# Patient Record
Sex: Male | Born: 1985 | Race: White | Hispanic: No | Marital: Single | Smoking: Former smoker
Health system: Southern US, Community
[De-identification: ages and names within clinical notes are randomized; demographics above are authoritative.]

## PROBLEM LIST (undated history)

## (undated) ENCOUNTER — Ambulatory Visit (HOSPITAL_COMMUNITY): Payer: MEDICAID

## (undated) DIAGNOSIS — T148XXA Other injury of unspecified body region, initial encounter: Secondary | ICD-10-CM

## (undated) DIAGNOSIS — J189 Pneumonia, unspecified organism: Secondary | ICD-10-CM

## (undated) HISTORY — PX: WISDOM TOOTH EXTRACTION: SHX21

## (undated) HISTORY — PX: TONSILLECTOMY: SUR1361

---
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
## 2006-05-17 ENCOUNTER — Encounter: Admission: RE | Admit: 2006-05-17 | Discharge: 2006-05-17 | Payer: Self-pay | Admitting: Family Medicine

## 2008-06-24 ENCOUNTER — Encounter: Admission: RE | Admit: 2008-06-24 | Discharge: 2008-06-24 | Payer: Self-pay | Admitting: Family Medicine

## 2009-08-11 ENCOUNTER — Encounter: Admission: RE | Admit: 2009-08-11 | Discharge: 2009-08-11 | Payer: Self-pay | Admitting: Family Medicine

## 2010-11-29 ENCOUNTER — Emergency Department (HOSPITAL_BASED_OUTPATIENT_CLINIC_OR_DEPARTMENT_OTHER)
Admission: EM | Admit: 2010-11-29 | Discharge: 2010-11-29 | Discharge status: Against Medical Advice | Payer: Self-pay | Attending: Emergency Medicine | Admitting: Emergency Medicine

## 2010-11-29 DIAGNOSIS — Z0389 Encounter for observation for other suspected diseases and conditions ruled out: Secondary | ICD-10-CM | POA: Insufficient documentation

## 2010-11-30 ENCOUNTER — Emergency Department (HOSPITAL_COMMUNITY)
Admission: EM | Admit: 2010-11-30 | Discharge: 2010-11-30 | Disposition: A | Discharge status: Home / Self Care | Payer: Self-pay | Attending: Emergency Medicine | Admitting: Emergency Medicine

## 2010-11-30 DIAGNOSIS — F411 Generalized anxiety disorder: Secondary | ICD-10-CM | POA: Insufficient documentation

## 2010-11-30 DIAGNOSIS — F1010 Alcohol abuse, uncomplicated: Secondary | ICD-10-CM | POA: Insufficient documentation

## 2010-11-30 DIAGNOSIS — S01501A Unspecified open wound of lip, initial encounter: Secondary | ICD-10-CM | POA: Insufficient documentation

## 2010-11-30 DIAGNOSIS — IMO0002 Reserved for concepts with insufficient information to code with codable children: Secondary | ICD-10-CM | POA: Insufficient documentation

## 2010-11-30 DIAGNOSIS — K1370 Unspecified lesions of oral mucosa: Secondary | ICD-10-CM | POA: Insufficient documentation

## 2010-11-30 DIAGNOSIS — R000 Tachycardia, unspecified: Secondary | ICD-10-CM | POA: Insufficient documentation

## 2012-03-24 ENCOUNTER — Ambulatory Visit
Admission: RE | Admit: 2012-03-24 | Discharge: 2012-03-24 | Disposition: A | Discharge status: Home / Self Care | Payer: BC Managed Care – PPO | Source: Ambulatory Visit | Attending: Family Medicine | Admitting: Family Medicine

## 2012-03-24 ENCOUNTER — Other Ambulatory Visit: Payer: Self-pay | Admitting: Family Medicine

## 2012-03-24 DIAGNOSIS — R071 Chest pain on breathing: Secondary | ICD-10-CM

## 2012-03-24 DIAGNOSIS — R0789 Other chest pain: Secondary | ICD-10-CM

## 2015-08-04 ENCOUNTER — Emergency Department (HOSPITAL_COMMUNITY)
Admission: EM | Admit: 2015-08-04 | Discharge: 2015-08-05 | Disposition: A | Discharge status: Home / Self Care | Payer: No Typology Code available for payment source | Attending: Emergency Medicine | Admitting: Emergency Medicine

## 2015-08-04 ENCOUNTER — Encounter (HOSPITAL_COMMUNITY): Payer: Self-pay | Admitting: *Deleted

## 2015-08-04 ENCOUNTER — Emergency Department (HOSPITAL_COMMUNITY): Payer: No Typology Code available for payment source

## 2015-08-04 DIAGNOSIS — S161XXA Strain of muscle, fascia and tendon at neck level, initial encounter: Secondary | ICD-10-CM | POA: Diagnosis not present

## 2015-08-04 DIAGNOSIS — S8002XA Contusion of left knee, initial encounter: Secondary | ICD-10-CM | POA: Diagnosis not present

## 2015-08-04 DIAGNOSIS — Y92410 Unspecified street and highway as the place of occurrence of the external cause: Secondary | ICD-10-CM | POA: Insufficient documentation

## 2015-08-04 DIAGNOSIS — Y998 Other external cause status: Secondary | ICD-10-CM | POA: Diagnosis not present

## 2015-08-04 DIAGNOSIS — S199XXA Unspecified injury of neck, initial encounter: Secondary | ICD-10-CM | POA: Diagnosis present

## 2015-08-04 DIAGNOSIS — S8991XA Unspecified injury of right lower leg, initial encounter: Secondary | ICD-10-CM | POA: Insufficient documentation

## 2015-08-04 DIAGNOSIS — Y9355 Activity, bike riding: Secondary | ICD-10-CM | POA: Diagnosis not present

## 2015-08-04 DIAGNOSIS — S39012A Strain of muscle, fascia and tendon of lower back, initial encounter: Secondary | ICD-10-CM | POA: Insufficient documentation

## 2015-08-04 MED ORDER — CYCLOBENZAPRINE HCL 10 MG PO TABS: 10.0000 mg | ORAL_TABLET | Freq: Two times a day (BID) | ORAL | Status: DC | PRN

## 2015-08-04 MED ORDER — CYCLOBENZAPRINE HCL 10 MG PO TABS
10.0000 mg | ORAL_TABLET | Freq: Once | ORAL | Status: AC
  Administered 2015-08-04: 10 mg via ORAL
  Filled 2015-08-04: qty 1

## 2015-08-04 MED ORDER — HYDROCODONE-ACETAMINOPHEN 5-325 MG PO TABS: 1.0000 | ORAL_TABLET | Freq: Four times a day (QID) | ORAL | Status: DC | PRN

## 2015-08-04 MED ORDER — HYDROCODONE-ACETAMINOPHEN 5-325 MG PO TABS
1.0000 | ORAL_TABLET | Freq: Once | ORAL | Status: AC
  Administered 2015-08-04: 1 via ORAL
  Filled 2015-08-04: qty 1
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
## 2015-08-04 NOTE — Discharge Instructions (Signed)
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
## 2015-08-06 ENCOUNTER — Emergency Department (HOSPITAL_COMMUNITY)
Admission: EM | Admit: 2015-08-06 | Discharge: 2015-08-06 | Disposition: A | Discharge status: Home / Self Care | Payer: No Typology Code available for payment source | Attending: Emergency Medicine | Admitting: Emergency Medicine

## 2015-08-06 ENCOUNTER — Encounter (HOSPITAL_COMMUNITY): Payer: Self-pay | Admitting: Emergency Medicine

## 2015-08-06 DIAGNOSIS — Z09 Encounter for follow-up examination after completed treatment for conditions other than malignant neoplasm: Secondary | ICD-10-CM | POA: Diagnosis present

## 2015-08-06 DIAGNOSIS — S161XXD Strain of muscle, fascia and tendon at neck level, subsequent encounter: Secondary | ICD-10-CM | POA: Insufficient documentation

## 2015-08-06 DIAGNOSIS — S8002XD Contusion of left knee, subsequent encounter: Secondary | ICD-10-CM

## 2015-08-06 MED ORDER — NAPROXEN 500 MG PO TABS: 500.0000 mg | ORAL_TABLET | Freq: Two times a day (BID) | ORAL | Status: DC

## 2015-08-06 MED ORDER — OXYCODONE-ACETAMINOPHEN 5-325 MG PO TABS: 2.0000 | ORAL_TABLET | ORAL | Status: DC | PRN

## 2015-08-06 NOTE — ED Notes (Signed)
Patient states was given norco and flexeril and "it doesn't touch the pain".   Patient requesting additional pain medicine.
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
## 2015-08-06 NOTE — Discharge Instructions (Signed)
Elastic Bandage and RICE °WHAT DOES AN ELASTIC BANDAGE DO? °Elastic bandages come in different shapes and sizes. They generally provide support to your injury and reduce swelling while you are healing, but they can perform different functions. Your health care provider will help you to decide what is best for your protection, recovery, or rehabilitation following an injury. °WHAT ARE SOME GENERAL TIPS FOR USING AN ELASTIC BANDAGE? °· Use the bandage as directed by the maker of the bandage that you are using. °· Do not wrap the bandage too tightly. This may cut off the circulation in the arm or leg in the area below the bandage. °¨ If part of your body beyond the bandage becomes blue, numb, cold, swollen, or is more painful, your bandage is most likely too tight. If this occurs, remove your bandage and reapply it more loosely. °· See your health care provider if the bandage seems to be making your problems worse rather than better. °· An elastic bandage should be removed and reapplied every 3-4 hours or as directed by your health care provider. °WHAT IS RICE? °The routine care of many injuries includes rest, ice, compression, and elevation (RICE therapy).  °Rest °Rest is required to allow your body to heal. Generally, you can resume your routine activities when you are comfortable and have been given permission by your health care provider. °Ice °Icing your injury helps to keep the swelling down and it reduces pain. Do not apply ice directly to your skin. °· Put ice in a plastic bag. °· Place a towel between your skin and the bag. °· Leave the ice on for 20 minutes, 2-3 times per day. °Do this for as long as you are directed by your health care provider. °Compression °Compression helps to keep swelling down, gives support, and helps with discomfort. Compression may be done with an elastic bandage. °Elevation °Elevation helps to reduce swelling and it decreases pain. If possible, your injured area should be placed at  or above the level of your heart or the center of your chest. °WHEN SHOULD I SEEK MEDICAL CARE? °You should seek medical care if: °· You have persistent pain and swelling. °· Your symptoms are getting worse rather than improving. °These symptoms may indicate that further evaluation or further X-rays are needed. Sometimes, X-rays may not show a small broken bone (fracture) until a number of days later. Make a follow-up appointment with your health care provider. Ask when your X-ray results will be ready. Make sure that you get your X-ray results. °WHEN SHOULD I SEEK IMMEDIATE MEDICAL CARE? °You should seek immediate medical care if: °· You have a sudden onset of severe pain at or below the area of your injury. °· You develop redness or increased swelling around your injury. °· You have tingling or numbness at or below the area of your injury that does not improve after you remove the elastic bandage. °  °This information is not intended to replace advice given to you by your health care provider. Make sure you discuss any questions you have with your health care provider. °  °Document Released: 08/28/2001 Document Revised: 11/27/2014 Document Reviewed: 10/22/2013 °[employer] Interactive Patient Education ©2016 [employer] ° °
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
## 2015-08-06 NOTE — ED Notes (Signed)
Pt stable, ambulatory, states understanding of discharge instructions 

## 2016-01-12 ENCOUNTER — Emergency Department (HOSPITAL_COMMUNITY): Payer: Self-pay

## 2016-01-12 ENCOUNTER — Emergency Department (HOSPITAL_COMMUNITY)
Admission: EM | Admit: 2016-01-12 | Discharge: 2016-01-12 | Disposition: A | Discharge status: Home / Self Care | Payer: Self-pay | Attending: Emergency Medicine | Admitting: Emergency Medicine

## 2016-01-12 ENCOUNTER — Encounter (HOSPITAL_COMMUNITY): Payer: Self-pay

## 2016-01-12 DIAGNOSIS — Y939 Activity, unspecified: Secondary | ICD-10-CM | POA: Insufficient documentation

## 2016-01-12 DIAGNOSIS — W11XXXA Fall on and from ladder, initial encounter: Secondary | ICD-10-CM | POA: Insufficient documentation

## 2016-01-12 DIAGNOSIS — M545 Low back pain: Secondary | ICD-10-CM | POA: Insufficient documentation

## 2016-01-12 DIAGNOSIS — Y929 Unspecified place or not applicable: Secondary | ICD-10-CM | POA: Insufficient documentation

## 2016-01-12 DIAGNOSIS — Y999 Unspecified external cause status: Secondary | ICD-10-CM | POA: Insufficient documentation

## 2016-01-12 MED ORDER — METHOCARBAMOL 500 MG PO TABS
1000.0000 mg | ORAL_TABLET | Freq: Once | ORAL | Status: AC
Start: 2016-01-12 — End: 2016-01-12
  Administered 2016-01-12: 1000 mg via ORAL
  Filled 2016-01-12: qty 2

## 2016-01-12 MED ORDER — NAPROXEN 500 MG PO TABS: 500.0000 mg | ORAL_TABLET | Freq: Two times a day (BID) | ORAL | 0 refills | Status: DC

## 2016-01-12 MED ORDER — METHOCARBAMOL 500 MG PO TABS
500.0000 mg | ORAL_TABLET | Freq: Two times a day (BID) | ORAL | 0 refills | Status: DC | PRN
Start: 2016-01-12 — End: 2016-04-15

## 2016-01-12 MED ORDER — HYDROCODONE-ACETAMINOPHEN 5-325 MG PO TABS
1.0000 | ORAL_TABLET | Freq: Once | ORAL | Status: AC
  Administered 2016-01-12: 1 via ORAL
  Filled 2016-01-12: qty 1

## 2016-01-12 NOTE — ED Notes (Signed)
Pt returned from xray
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
## 2016-01-12 NOTE — ED Triage Notes (Signed)
Pt reports he fell off a ladder on Friday that was approximately 20 feet in height. Pt reports he has chronic back pain and has gotten more severe lately. He reports recent falls due to having uncontrolled pain. Pt denies any dysuria or loss of bowel control.

## 2016-04-15 ENCOUNTER — Ambulatory Visit (HOSPITAL_COMMUNITY)
Admission: EM | Admit: 2016-04-15 | Discharge: 2016-04-15 | Disposition: A | Discharge status: Home / Self Care | Payer: Self-pay | Attending: Family Medicine | Admitting: Family Medicine

## 2016-04-15 ENCOUNTER — Encounter (HOSPITAL_COMMUNITY): Payer: Self-pay | Admitting: Emergency Medicine

## 2016-04-15 DIAGNOSIS — R6889 Other general symptoms and signs: Secondary | ICD-10-CM

## 2016-04-15 MED ORDER — OSELTAMIVIR PHOSPHATE 75 MG PO CAPS: 75.0000 mg | ORAL_CAPSULE | Freq: Two times a day (BID) | ORAL | 0 refills | Status: DC

## 2016-04-15 MED ORDER — HYDROCODONE-HOMATROPINE 5-1.5 MG/5ML PO SYRP: 5.0000 mL | ORAL_SOLUTION | Freq: Four times a day (QID) | ORAL | 0 refills | Status: DC | PRN

## 2016-04-15 NOTE — ED Provider Notes (Signed)
[facility]    CSN: [id number] Arrival date & time: 04/15/16  1454     History   Chief Complaint Chief Complaint  Patient presents with  . URI    HPI Jerry Fischer is a 31 y.o. male.   This is 31 year old man who presents with 2 days of upper respiratory symptoms. The illness began late Tuesday night, 40 hours ago, with violent nausea and vomiting as well as diarrhea. The vomiting subsided but he didn't stop having diarrhea until he started Imodium. He's been taking Tylenol as well.  Yesterday developed myalgias continued to have fever. He feels weak now but he is keeping fluids down. Patient works for a [employer]      History reviewed. No pertinent past medical history.  There are no active problems to display for this patient.   History reviewed. No pertinent surgical history.     Home Medications    Prior to Admission medications   Medication Sig Start Date End Date Taking? Authorizing Provider  HYDROcodone-homatropine (HYDROMET) 5-1.5 MG/5ML syrup Take 5 mLs by mouth every 6 (six) hours as needed for cough. 04/15/16   Elvina SidleKurt Zeda Gangwer, MD  oseltamivir (TAMIFLU) 75 MG capsule Take 1 capsule (75 mg total) by mouth every 12 (twelve) hours. 04/15/16   Elvina SidleKurt Jalea Bronaugh, MD    Family History No family history on file.  Social History Social History  Substance Use Topics  . Smoking status: Never Smoker  . Smokeless tobacco: Never Used  . Alcohol use No     Allergies   Patient has no known allergies.   Review of Systems Review of Systems  Constitutional: Positive for fatigue and fever.  HENT: Positive for sore throat.   Eyes: Negative.   Respiratory: Positive for cough.   Gastrointestinal: Positive for diarrhea, nausea and vomiting.  Genitourinary: Negative.   Musculoskeletal: Positive for arthralgias and myalgias.  Neurological: Positive for headaches.  Psychiatric/Behavioral: Negative.      Physical Exam Triage Vital Signs ED  Triage Vitals  Enc Vitals Group     BP 04/15/16 1523 125/83     Pulse Rate 04/15/16 1523 90     Resp 04/15/16 1523 20     Temp 04/15/16 1523 99.8 F (37.7 C)     Temp Source 04/15/16 1523 Oral     SpO2 04/15/16 1523 96 %     Weight --      Height --      Head Circumference --      Peak Flow --      Pain Score 04/15/16 1522 8     Pain Loc --      Pain Edu? --      Excl. in GC? --    No data found.   Updated Vital Signs BP 125/83 (BP Location: Left Arm)   Pulse 90   Temp 99.8 F (37.7 C) (Oral)   Resp 20   SpO2 96%    Physical Exam  Constitutional: He is oriented to person, place, and time. He appears well-developed and well-nourished.  HENT:  Head: Normocephalic.  Right Ear: External ear normal.  Left Ear: External ear normal.  Mouth/Throat: Oropharynx is clear and moist.  Eyes: Conjunctivae and EOM are normal. Pupils are equal, round, and reactive to light.  Neck: Normal range of motion. Neck supple.  Cardiovascular: Normal rate, regular rhythm and normal heart sounds.   Pulmonary/Chest: Effort normal and breath sounds normal.  Musculoskeletal: Normal range of motion.  Neurological: He  is alert and oriented to person, place, and time.  Skin: Skin is warm and dry.  Nursing note and vitals reviewed.    UC Treatments / Results  Labs (all labs ordered are listed, but only abnormal results are displayed) Labs Reviewed - No data to display  EKG  EKG Interpretation None       Radiology No results found.  Procedures Procedures (including critical care time)  Medications Ordered in UC Medications - No data to display   Initial Impression / Assessment and Plan / UC Course  I have reviewed the triage vital signs and the nursing notes.  Pertinent labs & imaging results that were available during my care of the patient were reviewed by me and considered in my medical decision making (see chart for details).     Final Clinical Impressions(s) / UC  Diagnoses   Final diagnoses:  Flu-like symptoms    New Prescriptions New Prescriptions   HYDROCODONE-HOMATROPINE (HYDROMET) 5-1.5 MG/5ML SYRUP    Take 5 mLs by mouth every 6 (six) hours as needed for cough.   OSELTAMIVIR (TAMIFLU) 75 MG CAPSULE    Take 1 capsule (75 mg total) by mouth every 12 (twelve) hours.     Elvina Sidle, MD 04/15/16 [phone]

## 2016-04-15 NOTE — Discharge Instructions (Signed)
He had the symptoms of influenza. He just stay home and just stay on liquids and crackers until her stomach settles. Please return in 2 days if you're still having symptoms

## 2016-04-15 NOTE — ED Triage Notes (Signed)
Nasal congestion, cough, general aches, fever.  Onset Tuesday.  Had a time frame of vomiting and diarrhea. One early morning stool today, no vomiting today

## 2016-06-25 ENCOUNTER — Encounter (HOSPITAL_COMMUNITY): Payer: Self-pay | Admitting: Emergency Medicine

## 2016-06-25 ENCOUNTER — Ambulatory Visit (HOSPITAL_COMMUNITY)
Admission: EM | Admit: 2016-06-25 | Discharge: 2016-06-25 | Disposition: A | Discharge status: Home / Self Care | Payer: Self-pay | Attending: Internal Medicine | Admitting: Internal Medicine

## 2016-06-25 DIAGNOSIS — L237 Allergic contact dermatitis due to plants, except food: Secondary | ICD-10-CM

## 2016-06-25 DIAGNOSIS — J069 Acute upper respiratory infection, unspecified: Secondary | ICD-10-CM

## 2016-06-25 DIAGNOSIS — B9789 Other viral agents as the cause of diseases classified elsewhere: Secondary | ICD-10-CM

## 2016-06-25 MED ORDER — PROMETHAZINE-DM 6.25-15 MG/5ML PO SYRP: 5.0000 mL | ORAL_SOLUTION | Freq: Four times a day (QID) | ORAL | 0 refills | Status: DC | PRN

## 2016-06-25 MED ORDER — FEXOFENADINE-PSEUDOEPHED ER 60-120 MG PO TB12: 1.0000 | ORAL_TABLET | Freq: Two times a day (BID) | ORAL | 0 refills | Status: DC

## 2016-06-25 MED ORDER — METHYLPREDNISOLONE SODIUM SUCC 125 MG IJ SOLR
INTRAMUSCULAR | Status: AC
  Filled 2016-06-25: qty 2

## 2016-06-25 MED ORDER — METHYLPREDNISOLONE SODIUM SUCC 125 MG IJ SOLR
80.0000 mg | Freq: Once | INTRAMUSCULAR | Status: AC
  Administered 2016-06-25: 80 mg via INTRAMUSCULAR

## 2016-06-25 NOTE — ED Provider Notes (Signed)
CSN: [id number]     Arrival date & time 06/25/16  1607 History   First MD Initiated Contact with Patient 06/25/16 1712     Chief Complaint  Patient presents with  . Poison Ivy  . URI   (Consider location/radiation/quality/duration/timing/severity/associated sxs/prior Treatment) Subjective:   Jerry Fischer is a 31 y.o. male here for evaluation of a cough.  The cough is productive of green/yellow sputum. His chest hurts during coughing. The cough has been waxing and waning over time and is aggravated by nothing. Onset of symptoms was 4 days ago and has been unchanged since that time. He denies any wheezing, dyspnea or hemoptysis. Patient does not have a history of asthma. Patient has not had recent travel. Patient does not have a history of smoking.   The patient also complains of a rash. Rash started 4 days ago after he accidentally got into some poison ivy. Initial distribution: abdomen, bilateral arm and bilateral lower leg. Lesions are pink in color and are of raised texture. The rash is extensively pruritic but has not changed over time. Patient applied Clorox and calamine lotion to the rash without much relief.   The following portions of the patient's history were reviewed and updated as appropriate: allergies, current medications, past family history, past medical history, past social history, past surgical history and problem list.           History reviewed. No pertinent past medical history. History reviewed. No pertinent surgical history. History reviewed. No pertinent family history. Social History  Substance Use Topics  . Smoking status: Never Smoker  . Smokeless tobacco: Never Used  . Alcohol use No    Review of Systems  HENT: Positive for congestion and rhinorrhea. Negative for sore throat.   Respiratory: Positive for cough. Negative for chest tightness and shortness of breath.   Cardiovascular:       Pleuritic chest pain   Gastrointestinal: Negative for nausea  and vomiting.  Skin: Positive for rash.    Allergies  Patient has no known allergies.  Home Medications   Prior to Admission medications   Medication Sig Start Date End Date Taking? Authorizing Provider  HYDROcodone-homatropine (HYDROMET) 5-1.5 MG/5ML syrup Take 5 mLs by mouth every 6 (six) hours as needed for cough. 04/15/16   Elvina Sidle, MD  oseltamivir (TAMIFLU) 75 MG capsule Take 1 capsule (75 mg total) by mouth every 12 (twelve) hours. 04/15/16   Elvina Sidle, MD   Meds Ordered and Administered this Visit   Medications  methylPREDNISolone sodium succinate (SOLU-MEDROL) 125 mg/2 mL injection 80 mg (not administered)    BP 123/68 (BP Location: Right Arm)   Pulse 80   Temp 98.8 F (37.1 C) (Oral)   Resp 18   SpO2 96%  No data found.   Physical Exam  Constitutional: He is oriented to person, place, and time. He appears well-developed and well-nourished. No distress.  Eyes: Conjunctivae are normal. Pupils are equal, round, and reactive to light.  Cardiovascular: Normal rate, regular rhythm and normal heart sounds.   Pulmonary/Chest: Effort normal and breath sounds normal.  Neurological: He is alert and oriented to person, place, and time.  Skin: Skin is warm and dry. Rash noted.  Diffused rash to bilateral arms, abdomen and bilateral upper legs. Lesions are raised and red in color. No evidence of cellulitis. No drainage.    Psychiatric: He has a normal mood and affect.    Urgent Care Course     Procedures (including critical care time)  Labs Review Labs Reviewed - No data to display  Imaging Review No results found.   Visual Acuity Review  Right Eye Distance:   Left Eye Distance:   Bilateral Distance:    Right Eye Near:   Left Eye Near:    Bilateral Near:         MDM   1. Viral URI with cough   2. Poison ivy dermatitis    VIRAL URI w/ COUGH: Rx Allegra-D and promethazine-dextromethorphan PRN for cough.  Explained lack of efficacy of  antibiotics in viral disease. Avoid exposure to tobacco smoke and fumes. Call if shortness of breath worsens, blood in sputum, change in character of cough, development of fever or chills, inability to maintain nutrition and hydration. Avoid exposure to tobacco smoke and fumes.  POISON IVY: Solumedrol IM injection in clinic. Continue with topical therapies (calamine lotion, wet compresses). Avoid scratching.   Follow-up in 1 week, or sooner as needed.  Discussed diagnosis and treatment with patient. All questions have been answered and all concerns have been addressed. The patient verbalized understanding and had no further questions     Lurline Idol, FNP 06/25/16 1734

## 2016-06-25 NOTE — ED Triage Notes (Signed)
Here for poison ivy/oak onset x3 days on arms, abd   Also c/o cold sx onset 3 days associated w/cough  Denies fevers  A&O x4... NAD

## 2017-10-31 ENCOUNTER — Emergency Department (HOSPITAL_COMMUNITY): Payer: Self-pay

## 2017-10-31 ENCOUNTER — Emergency Department (HOSPITAL_COMMUNITY)
Admission: EM | Admit: 2017-10-31 | Discharge: 2017-10-31 | Disposition: A | Discharge status: Home / Self Care | Payer: Self-pay | Attending: Emergency Medicine | Admitting: Emergency Medicine

## 2017-10-31 ENCOUNTER — Encounter (HOSPITAL_COMMUNITY): Payer: Self-pay | Admitting: *Deleted

## 2017-10-31 DIAGNOSIS — W11XXXA Fall on and from ladder, initial encounter: Secondary | ICD-10-CM | POA: Insufficient documentation

## 2017-10-31 DIAGNOSIS — T148XXA Other injury of unspecified body region, initial encounter: Secondary | ICD-10-CM

## 2017-10-31 DIAGNOSIS — Y929 Unspecified place or not applicable: Secondary | ICD-10-CM | POA: Insufficient documentation

## 2017-10-31 DIAGNOSIS — Y9389 Activity, other specified: Secondary | ICD-10-CM | POA: Insufficient documentation

## 2017-10-31 DIAGNOSIS — S199XXA Unspecified injury of neck, initial encounter: Secondary | ICD-10-CM | POA: Insufficient documentation

## 2017-10-31 DIAGNOSIS — M7918 Myalgia, other site: Secondary | ICD-10-CM

## 2017-10-31 DIAGNOSIS — Y999 Unspecified external cause status: Secondary | ICD-10-CM | POA: Insufficient documentation

## 2017-10-31 DIAGNOSIS — W19XXXA Unspecified fall, initial encounter: Secondary | ICD-10-CM

## 2017-10-31 MED ORDER — METHOCARBAMOL 500 MG PO TABS: 500.0000 mg | ORAL_TABLET | Freq: Every evening | ORAL | 0 refills | Status: DC | PRN

## 2017-10-31 NOTE — ED Provider Notes (Signed)
MSE was initiated and I personally evaluated the patient and placed orders (if any) at  5:03 PM on October 31, 2017.  The patient appears stable so that the remainder of the MSE may be completed by another provider.  Patient placed in Quick Look pathway, seen and evaluated   Chief Complaint: Fall from roof  HPI:   Patient is a 32 year old male with no significant past history presenting for fall from a roof yesterday.  Patient unable to identify proximally how many feet, however he reports that he was on the upper part of the roof on second level.  Patient reports that he had a mechanical fall off the roof when he lost his footing, slid down the ladder, and fell onto his back and posterior school.  Patient reports that he does not believe he lost consciousness, but has become progressively more uncomfortable in the neck and upper back since last night.  Reports he had numbness in all of his fingers of bilateral hands while sleeping last night.  Multiple abrasions sustained, but patient has tetanus up-to-date within the last 1 to 2 years.  ROS: See HPI (one)  Physical Exam:   Gen: No distress  Neuro: Awake and Alert  Skin: Warm    Focused Exam: Patient has midline tenderness of the mid to lower cervical spine.  Step-off is appreciated.  Collar immediately applied during exam.  Patient has paraspinal thoracic lumbar muscular tenderness.  Strength 5 out of 5 5 in upper and lower extremities.  Patient denying loss of sensation to soft touch on exam.  No rib or flank tenderness or ecchymosis.   Multiple abrasions noted on lower extremities.   Initiation of care has begun. The patient has been counseled on the process, plan, and necessity for staying for the completion/evaluation, and the remainder of the medical screening examination    Delia ChimesMurray, Atalie Oros B, PA-C 10/31/17 1711    Margarita Grizzleay, Danielle, MD 10/31/17 2330

## 2017-10-31 NOTE — Discharge Instructions (Signed)
Take antiinflammatories scheduled. You amy take ibuprofen or aleve, but do not take them together. You may supplement with Tylenol if you need further pain control. Use Robaxin as needed for muscle stiffness or soreness. Have caution, as this may make you tired or groggy. Do not drive or operate heavy machinery while taking this medication.  Use muscle creams (bengay, icy hot, salonpas) as needed for pain.  You will likely have continued muscle stiffness and soreness over the next several days.  Follow-up for reevaluation if your symptoms are not improving within a week. Return to the ER if you develop high fevers, numbness, loss of bowel or bladder control, or any new or concerning symptoms.

## 2017-10-31 NOTE — ED Notes (Signed)
Patient verbalizes understanding of discharge instructions. Opportunity for questioning and answers were provided. Ambulatory at discharge in NAD.  

## 2017-10-31 NOTE — ED Notes (Signed)
Unable to locate for triage x2 

## 2017-10-31 NOTE — ED Triage Notes (Addendum)
Pt in stating he fell off a roof yesterday, sliding down a ladder approx 14 ft, denies LOC but did hit his head, c/o neck and upper back stiffness today, ambulatory without distress

## 2017-10-31 NOTE — ED Provider Notes (Signed)
[facility]   CSN: [id number] Arrival date & time: 10/31/17  1632     History   Chief Complaint Chief Complaint  Patient presents with  . Fall    HPI Derrek MonacoJustin H Shake is a 32 y.o. male presenting for evaluation after a fall.   Pt states he was on a ladder yesterday around 2 PM when the ladder slipped and he fell, landing on his back. The fall was approximately 14 ft. He denies LOC. He is not on blood thinners. He reports soreness of his arms and legs. Today he had severe midline cervical pain, and difficulty turning his head form side to side due to the pain. He reports a mild HA where he hit his head. No vision changes, slurred speech, CP, SOB, N/V, abd pain, loss of bowel or bladder control, numbness or tingling. He has some mid and low back tenderness as well. He has no other medical problems, does not take medications daily. Pain is worse with movement and palpation, nothing has been making it better.   HPI  History reviewed. No pertinent past medical history.  There are no active problems to display for this patient.   History reviewed. No pertinent surgical history.      Home Medications    Prior to Admission medications   Medication Sig Start Date End Date Taking? Authorizing Provider  methocarbamol (ROBAXIN) 500 MG tablet Take 1 tablet (500 mg total) by mouth at bedtime as needed for muscle spasms. 10/31/17   Fortunata Betty, PA-C    Family History History reviewed. No pertinent family history.  Social History Social History   Tobacco Use  . Smoking status: Never Smoker  . Smokeless tobacco: Never Used  Substance Use Topics  . Alcohol use: No  . Drug use: No     Allergies   Patient has no known allergies.   Review of Systems Review of Systems  Musculoskeletal: Positive for back pain, myalgias and neck pain.  Neurological: Positive for headaches.  All other systems reviewed and are  negative.    Physical Exam Updated Vital Signs BP 118/77   Pulse 67   Temp 98.2 F (36.8 C) (Oral)   Resp 16   SpO2 100%   Physical Exam  Constitutional: He is oriented to person, place, and time. He appears well-developed and well-nourished. No distress.  Sitting in bed in NAD  HENT:  Head: Normocephalic and atraumatic.  Right Ear: Tympanic membrane, external ear and ear canal normal.  Left Ear: Tympanic membrane, external ear and ear canal normal.  Nose: Nose normal.  Mouth/Throat: Uvula is midline, oropharynx is clear and moist and mucous membranes are normal.  No malocclusion. Mild TTP of occipital head, no TTP elsewhere.  No obvious laceration, hematoma or injury.   Eyes: Pupils are equal, round, and reactive to light. EOM are normal.  Neck: Normal range of motion. Neck supple.  TTP of midline c-spine. Decreased ROM due to pain.   Cardiovascular: Normal rate, regular rhythm and intact distal pulses.  Pulmonary/Chest: Effort normal and breath sounds normal. He exhibits no tenderness.  No TTP of the chest wall  Abdominal: Soft. He exhibits no distension. There is no tenderness.  No TTP of the abd.   Musculoskeletal: Normal range of motion. He exhibits tenderness.  TTP of midline low back and midline thoracic back. No obvious deformties or step offs. No deformity of the arms or legs. Pt is ambulatory. Strength intact x4. Sensation intact  x4. Radial and pedal pulses equal bilaterally. Soft compartments.   Neurological: He is alert and oriented to person, place, and time. He has normal strength. No cranial nerve deficit or sensory deficit. GCS eye subscore is 4. GCS verbal subscore is 5. GCS motor subscore is 6.  Fine movement and coordination intact  Skin: Skin is warm. Capillary refill takes less than 2 seconds.  Psychiatric: He has a normal mood and affect.  Nursing note and vitals reviewed.    ED Treatments / Results  Labs (all labs ordered are listed, but only abnormal  results are displayed) Labs Reviewed - No data to display  EKG None  Radiology Dg Thoracic Spine 2 View  Result Date: 10/31/2017 CLINICAL DATA:  Larey Seat off ladder EXAM: THORACIC SPINE 2 VIEWS COMPARISON:  Chest x-ray 03/24/2012 FINDINGS: There is no evidence of thoracic spine fracture. Alignment is normal. No other significant bone abnormalities are identified. IMPRESSION: Negative. Electronically Signed   By: Jasmine Pang M.D.   On: 10/31/2017 17:52   Dg Lumbar Spine Complete  Result Date: 10/31/2017 CLINICAL DATA:  Larey Seat off ladder, back pain EXAM: LUMBAR SPINE - COMPLETE 4+ VIEW COMPARISON:  01/12/2016 FINDINGS: There is no evidence of lumbar spine fracture. Alignment is normal. Intervertebral disc spaces are maintained. IMPRESSION: Negative. Electronically Signed   By: Jasmine Pang M.D.   On: 10/31/2017 17:53   Ct Head Wo Contrast  Result Date: 10/31/2017 CLINICAL DATA:  Patient fell about 12 feet from a roof and landed on a deck. Neck pain. EXAM: CT HEAD WITHOUT CONTRAST CT CERVICAL SPINE WITHOUT CONTRAST TECHNIQUE: Multidetector CT imaging of the head and cervical spine was performed following the standard protocol without intravenous contrast. Multiplanar CT image reconstructions of the cervical spine were also generated. COMPARISON:  None. FINDINGS: CT HEAD FINDINGS Brain: There is no evidence for acute hemorrhage, hydrocephalus, mass lesion, or abnormal extra-axial fluid collection. No definite CT evidence for acute infarction. Vascular: No hyperdense vessel or unexpected calcification. Skull: No evidence for fracture. No worrisome lytic or sclerotic lesion. Sinuses/Orbits: The visualized paranasal sinuses and mastoid air cells are clear. Visualized portions of the globes and intraorbital fat are unremarkable. Other: None. CT CERVICAL SPINE FINDINGS Alignment: Normal. Skull base and vertebrae: No acute fracture. No primary bone lesion or focal pathologic process. Soft tissues and spinal  canal: No prevertebral fluid or swelling. No visible canal hematoma. Disc levels:  Preserved. Upper chest: Negative. Other: None. IMPRESSION: 1. Normal CT evaluation of the brain. 2. No cervical spine fracture. Electronically Signed   By: Kennith Center M.D.   On: 10/31/2017 20:03   Ct Cervical Spine Wo Contrast  Result Date: 10/31/2017 CLINICAL DATA:  Patient fell about 12 feet from a roof and landed on a deck. Neck pain. EXAM: CT HEAD WITHOUT CONTRAST CT CERVICAL SPINE WITHOUT CONTRAST TECHNIQUE: Multidetector CT imaging of the head and cervical spine was performed following the standard protocol without intravenous contrast. Multiplanar CT image reconstructions of the cervical spine were also generated. COMPARISON:  None. FINDINGS: CT HEAD FINDINGS Brain: There is no evidence for acute hemorrhage, hydrocephalus, mass lesion, or abnormal extra-axial fluid collection. No definite CT evidence for acute infarction. Vascular: No hyperdense vessel or unexpected calcification. Skull: No evidence for fracture. No worrisome lytic or sclerotic lesion. Sinuses/Orbits: The visualized paranasal sinuses and mastoid air cells are clear. Visualized portions of the globes and intraorbital fat are unremarkable. Other: None. CT CERVICAL SPINE FINDINGS Alignment: Normal. Skull base and vertebrae: No acute fracture. No  primary bone lesion or focal pathologic process. Soft tissues and spinal canal: No prevertebral fluid or swelling. No visible canal hematoma. Disc levels:  Preserved. Upper chest: Negative. Other: None. IMPRESSION: 1. Normal CT evaluation of the brain. 2. No cervical spine fracture. Electronically Signed   By: Kennith CenterEric  Mansell M.D.   On: 10/31/2017 20:03    Procedures Procedures (including critical care time)  Medications Ordered in ED Medications - No data to display   Initial Impression / Assessment and Plan / ED Course  I have reviewed the triage vital signs and the nursing notes.  Pertinent labs &  imaging results that were available during my care of the patient were reviewed by me and considered in my medical decision making (see chart for details).     Pt presenting for evaluation after a fall.  Physical exam reassuring, no obvious neurologic deficits.  However, patient with increased pain over midline C-spine, difficulty moving his head due to pain.  Significant fall, greater than 10 feet.  X-rays of thoracic and low back viewed interpreted by me, no fractures or dislocations.  Pain is reproducible with palpation of the surrounding musculature.  CT head and neck pending.  CT head and neck reassuring, no new fracture or dislocation.  No intracranial bleed.  Discussed findings with patient.  Discussed symptoms are likely related to muscle strain/soreness.  Discussed treatment with anti-inflammatories, muscle relaxers, muscle creams.  Discussed concerning symptoms including new numbness and signs of intracranial head injury.  At this time, patient appears safe for discharge.  Return precautions given.  Patient states he understands and agrees plan.   Final Clinical Impressions(s) / ED Diagnoses   Final diagnoses:  Fall, initial encounter  Muscle strain  Musculoskeletal pain    ED Discharge Orders         Ordered    methocarbamol (ROBAXIN) 500 MG tablet  At bedtime PRN     10/31/17 2010           Traycen Goyer, PA-C 10/31/17 2057    Loren RacerYelverton, David, MD 11/03/17 [phone]

## 2018-04-22 ENCOUNTER — Ambulatory Visit (HOSPITAL_COMMUNITY)
Admission: EM | Admit: 2018-04-22 | Discharge: 2018-04-22 | Disposition: A | Discharge status: Home / Self Care | Payer: Self-pay | Attending: Internal Medicine | Admitting: Internal Medicine

## 2018-04-22 ENCOUNTER — Encounter (HOSPITAL_COMMUNITY): Payer: Self-pay | Admitting: Emergency Medicine

## 2018-04-22 DIAGNOSIS — J0100 Acute maxillary sinusitis, unspecified: Secondary | ICD-10-CM

## 2018-04-22 MED ORDER — AMOXICILLIN-POT CLAVULANATE 875-125 MG PO TABS: 1.0000 | ORAL_TABLET | Freq: Two times a day (BID) | ORAL | 0 refills | Status: DC

## 2018-04-22 MED ORDER — FLUTICASONE PROPIONATE 50 MCG/ACT NA SUSP: 2.0000 | Freq: Every day | NASAL | 0 refills | Status: DC

## 2018-04-22 MED ORDER — BENZONATATE 200 MG PO CAPS: 200.0000 mg | ORAL_CAPSULE | Freq: Three times a day (TID) | ORAL | 0 refills | Status: DC | PRN

## 2018-04-22 NOTE — Discharge Instructions (Addendum)
Do the saline rinses 1 hour after using the Flonase and do them twice a day for 35 days, or til the pressure on your face resolves.

## 2018-04-22 NOTE — ED Triage Notes (Signed)
Pt c/o sinus pain x2 days, states hes congestion and headaches and sinus pressure.

## 2018-05-23 ENCOUNTER — Encounter (HOSPITAL_COMMUNITY): Payer: Self-pay | Admitting: Emergency Medicine

## 2018-05-23 ENCOUNTER — Ambulatory Visit (HOSPITAL_COMMUNITY)
Admission: EM | Admit: 2018-05-23 | Discharge: 2018-05-23 | Disposition: A | Discharge status: Home / Self Care | Payer: Self-pay | Attending: Family Medicine | Admitting: Family Medicine

## 2018-05-23 DIAGNOSIS — J069 Acute upper respiratory infection, unspecified: Secondary | ICD-10-CM

## 2018-05-23 DIAGNOSIS — B9789 Other viral agents as the cause of diseases classified elsewhere: Secondary | ICD-10-CM

## 2018-05-23 DIAGNOSIS — Z113 Encounter for screening for infections with a predominantly sexual mode of transmission: Secondary | ICD-10-CM

## 2018-05-23 DIAGNOSIS — Z202 Contact with and (suspected) exposure to infections with a predominantly sexual mode of transmission: Secondary | ICD-10-CM

## 2018-05-23 DIAGNOSIS — R05 Cough: Secondary | ICD-10-CM

## 2018-05-23 MED ORDER — CEFTRIAXONE SODIUM 250 MG IJ SOLR
250.0000 mg | Freq: Once | INTRAMUSCULAR | Status: AC
  Administered 2018-05-23: 250 mg via INTRAMUSCULAR

## 2018-05-23 MED ORDER — FLUTICASONE PROPIONATE 50 MCG/ACT NA SUSP: 2.0000 | Freq: Every day | NASAL | 0 refills | Status: DC

## 2018-05-23 MED ORDER — CEFTRIAXONE SODIUM 250 MG IJ SOLR
INTRAMUSCULAR | Status: AC
  Filled 2018-05-23: qty 250

## 2018-05-23 MED ORDER — AZITHROMYCIN 250 MG PO TABS
1000.0000 mg | ORAL_TABLET | Freq: Once | ORAL | Status: AC
  Administered 2018-05-23: 1000 mg via ORAL

## 2018-05-23 MED ORDER — AZITHROMYCIN 250 MG PO TABS
ORAL_TABLET | ORAL | Status: AC
  Filled 2018-05-23: qty 4

## 2018-05-23 MED ORDER — BENZONATATE 200 MG PO CAPS: 200.0000 mg | ORAL_CAPSULE | Freq: Three times a day (TID) | ORAL | 0 refills | Status: DC | PRN

## 2018-05-23 NOTE — ED Provider Notes (Signed)
[facility]    CSN: [id number] Arrival date & time: 05/23/18  1606     History   Chief Complaint Chief Complaint  Patient presents with  . Cold Symptoms    HPI Jerry Fischer is a 33 y.o. male.   HPI  Patient is here for a "bad cold".  He has nasal congestion, sore throat, fatigue, fever, and cough for the last 2 days.  He states that he has not taken any over-the-counter medications.  He is a smoker. In addition he states he is been exposed to chlamydia.  His girlfriend just notified him yesterday.  They are just starting in a new relationship.  He does not think there is any other infection involved.  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  He did not have HPV shots.  He states that he just had a physical in December with blood work.  He declines RPR and HIV testing.  History reviewed. No pertinent past medical history.  There are no active problems to display for this patient.   History reviewed. No pertinent surgical history.     Home Medications    Prior to Admission medications   Medication Sig Start Date End Date Taking? Authorizing Provider  benzonatate (TESSALON) 200 MG capsule Take 1 capsule (200 mg total) by mouth 3 (three) times daily as needed for cough. 05/23/18   Eustace Moore, MD  fluticasone [facility]) 50 MCG/ACT nasal spray Place 2 sprays into both nostrils daily for 7 days. 05/23/18 05/30/18  Eustace Moore, MD    Family History No family history on file.  Social History Social History   Tobacco Use  . Smoking status: Never Smoker  . Smokeless tobacco: Never Used  Substance Use Topics  . Alcohol use: No  . Drug use: No     Allergies   Patient has no known allergies.   Review of Systems Review of Systems  Constitutional: Positive for fatigue. Negative for chills and fever.  HENT: Positive for congestion, postnasal drip and rhinorrhea. Negative for ear pain and sore throat.   Eyes: Negative for pain and visual disturbance.    Respiratory: Positive for cough. Negative for shortness of breath.   Cardiovascular: Negative for chest pain and palpitations.  Gastrointestinal: Negative for abdominal pain and vomiting.  Genitourinary: Positive for discharge. Negative for dysuria and hematuria.  Musculoskeletal: Negative for arthralgias and back pain.  Skin: Negative for color change and rash.  Neurological: Negative for seizures and syncope.  All other systems reviewed and are negative.    Physical Exam Triage Vital Signs ED Triage Vitals  Enc Vitals Group     BP 05/23/18 1626 117/78     Pulse Rate 05/23/18 1626 91     Resp 05/23/18 1626 16     Temp 05/23/18 1626 98.5 F (36.9 C)     Temp src --      SpO2 05/23/18 1626 97 %     Weight --      Height --      Head Circumference --      Peak Flow --      Pain Score 05/23/18 1627 6     Pain Loc --      Pain Edu? --      Excl. in GC? --    No data found.  Updated Vital Signs BP 117/78   Pulse 91   Temp 98.5 F (36.9 C)   Resp 16   SpO2 97%  Physical Exam Constitutional:      General: He is not in acute distress.    Appearance: He is well-developed.  HENT:     Head: Normocephalic and atraumatic.     Right Ear: Tympanic membrane and ear canal normal.     Left Ear: Tympanic membrane and ear canal normal.     Nose: Rhinorrhea present.     Mouth/Throat:     Pharynx: Posterior oropharyngeal erythema present.  Eyes:     Conjunctiva/sclera: Conjunctivae normal.     Pupils: Pupils are equal, round, and reactive to light.  Neck:     Musculoskeletal: Normal range of motion.  Cardiovascular:     Rate and Rhythm: Normal rate and regular rhythm.     Heart sounds: Normal heart sounds.  Pulmonary:     Effort: Pulmonary effort is normal. No respiratory distress.     Breath sounds: Normal breath sounds.  Abdominal:     General: There is no distension.     Palpations: Abdomen is soft.  Musculoskeletal: Normal range of motion.  Skin:    General:  Skin is warm and dry.  Neurological:     Mental Status: He is alert.      UC Treatments / Results  Labs (all labs ordered are listed, but only abnormal results are displayed) Labs Reviewed  URINE CYTOLOGY ANCILLARY ONLY    EKG None  Radiology No results found.  Procedures Procedures (including critical care time)  Medications Ordered in UC Medications  cefTRIAXone (ROCEPHIN) injection 250 mg (has no administration in time range)  azithromycin (ZITHROMAX) tablet 1,000 mg (has no administration in time range)    Initial Impression / Assessment and Plan / UC Course  I have reviewed the triage vital signs and the nursing notes.  Pertinent labs & imaging results that were available during my care of the patient were reviewed by me and considered in my medical decision making (see chart for details).     *Patient has a viral URI and STD exposure with penile discharge.  For the cough he just needs symptomatic treatment.  We will do appropriate testing and treatment for the penile discharge.  He declines blood testing for HIV and RPR stating he had this done in December. Final Clinical Impressions(s) / UC Diagnoses   Final diagnoses:  Exposure to STD  Viral upper respiratory tract infection with cough     Discharge Instructions     Rest and push fluids Try to cut back or quit smoking Take Tessalon every 12 hours for cough May take Mucinex DM in addition Use Flonase for the nasal congestion and drainage We did lab testing during this visit.  If there are any abnormal findings that require change in medicine or indicate a positive result, you will be notified.  If all of your tests are normal, you will not be called.      ED Prescriptions    Medication Sig Dispense Auth. Provider   benzonatate (TESSALON) 200 MG capsule Take 1 capsule (200 mg total) by mouth 3 (three) times daily as needed for cough. 30 capsule Eustace Moore, MD   fluticasone [facility]) 50 MCG/ACT  nasal spray Place 2 sprays into both nostrils daily for 7 days. 18.2 g Eustace Moore, MD     Controlled Substance Prescriptions  Controlled Substance Registry consulted? Not Applicable   Eustace Moore, MD 05/23/18 2200

## 2018-05-23 NOTE — Discharge Instructions (Addendum)
Rest and push fluids Try to cut back or quit smoking Take Tessalon every 12 hours for cough May take Mucinex DM in addition Use Flonase for the nasal congestion and drainage We did lab testing during this visit.  If there are any abnormal findings that require change in medicine or indicate a positive result, you will be notified.  If all of your tests are normal, you will not be called.

## 2018-05-23 NOTE — ED Triage Notes (Signed)
Pt c/o sinus issues, cough, facial pain, states hes had a fever on and off. x2 days.

## 2018-05-25 ENCOUNTER — Other Ambulatory Visit: Payer: Self-pay | Admitting: Internal Medicine

## 2018-09-16 ENCOUNTER — Other Ambulatory Visit: Payer: Self-pay

## 2018-09-16 ENCOUNTER — Emergency Department (HOSPITAL_COMMUNITY)
Admission: EM | Admit: 2018-09-16 | Discharge: 2018-09-16 | Disposition: A | Discharge status: Home / Self Care | Payer: Self-pay | Attending: Emergency Medicine | Admitting: Emergency Medicine

## 2018-09-16 ENCOUNTER — Encounter (HOSPITAL_COMMUNITY): Payer: Self-pay

## 2018-09-16 DIAGNOSIS — R112 Nausea with vomiting, unspecified: Secondary | ICD-10-CM | POA: Insufficient documentation

## 2018-09-16 LAB — CBC WITH DIFFERENTIAL/PLATELET
Abs Immature Granulocytes: 0.02 10*3/uL (ref 0.00–0.07)
Basophils Absolute: 0 10*3/uL (ref 0.0–0.1)
Basophils Relative: 0 %
Eosinophils Absolute: 0 10*3/uL (ref 0.0–0.5)
Eosinophils Relative: 0 %
HCT: 45.1 % (ref 39.0–52.0)
Hemoglobin: 15 g/dL (ref 13.0–17.0)
Immature Granulocytes: 0 %
Lymphocytes Relative: 21 %
Lymphs Abs: 1.6 10*3/uL (ref 0.7–4.0)
MCH: 30.2 pg (ref 26.0–34.0)
MCHC: 33.3 g/dL (ref 30.0–36.0)
MCV: 90.9 fL (ref 80.0–100.0)
Monocytes Absolute: 0.7 10*3/uL (ref 0.1–1.0)
Monocytes Relative: 9 %
Neutro Abs: 5.3 10*3/uL (ref 1.7–7.7)
Neutrophils Relative %: 70 %
Platelets: 241 10*3/uL (ref 150–400)
RBC: 4.96 MIL/uL (ref 4.22–5.81)
RDW: 12.9 % (ref 11.5–15.5)
WBC: 7.6 10*3/uL (ref 4.0–10.5)
nRBC: 0 % (ref 0.0–0.2)

## 2018-09-16 LAB — COMPREHENSIVE METABOLIC PANEL
ALT: 20 U/L (ref 0–44)
AST: 16 U/L (ref 15–41)
Albumin: 4.4 g/dL (ref 3.5–5.0)
Alkaline Phosphatase: 52 U/L (ref 38–126)
Anion gap: 11 (ref 5–15)
BUN: 10 mg/dL (ref 6–20)
CO2: 27 mmol/L (ref 22–32)
Calcium: 9.4 mg/dL (ref 8.9–10.3)
Chloride: 102 mmol/L (ref 98–111)
Creatinine, Ser: 1.03 mg/dL (ref 0.61–1.24)
GFR calc Af Amer: >60 mL/min (ref >60)
GFR calc non Af Amer: >60 mL/min (ref >60)
Glucose, Bld: 96 mg/dL (ref 70–99)
Potassium: 3.5 mmol/L (ref 3.5–5.1)
Sodium: 140 mmol/L (ref 135–145)
Total Bilirubin: 0.5 mg/dL (ref 0.3–1.2)
Total Protein: 7.2 g/dL (ref 6.5–8.1)

## 2018-09-16 MED ORDER — METOCLOPRAMIDE HCL 5 MG/ML IJ SOLN
10.0000 mg | Freq: Once | INTRAMUSCULAR | Status: AC
  Administered 2018-09-16: 10 mg via INTRAVENOUS
  Filled 2018-09-16: qty 2

## 2018-09-16 MED ORDER — LACTATED RINGERS IV BOLUS
1000.0000 mL | Freq: Once | INTRAVENOUS | Status: AC
  Administered 2018-09-16: 1000 mL via INTRAVENOUS

## 2018-09-16 NOTE — ED Provider Notes (Signed)
[facility] EMERGENCY DEPARTMENT Provider Note   CSN: [id number] Arrival date & time: 09/16/18  1128    History   Chief Complaint Chief Complaint  Patient presents with  . Nausea  . Emesis    HPI Jerry Fischer is a 33 y.o. male.     HPI  33 year old male presents with vomiting.  Started 2 evenings ago.  He was at [facility] [place] and they gave him parenteral Phenergan as well as p.o. Phenergan later and then eventually Zofran.  However he is continued to have vomiting so he checked himself out and came here.  He is intermittently able to keep down some water and Gatorade but still having numerous episodes of vomiting.  Someone else at the facility also started having vomiting close to when he did so he wonders if he caught something.  No fever, cough, shortness of breath, chest pain, abdominal pain, diarrhea or hematemesis. Denies illicit drug use or ETOH.  Feels generally weak.  History reviewed. No pertinent past medical history.  There are no active problems to display for this patient.   History reviewed. No pertinent surgical history.      Home Medications    Prior to Admission medications   Medication Sig Start Date End Date Taking? Authorizing Provider  benzonatate (TESSALON) 200 MG capsule Take 1 capsule (200 mg total) by mouth 3 (three) times daily as needed for cough. Patient not taking: Reported on 09/16/2018 05/23/18   Raylene Everts, MD  fluticasone [facility]) 50 MCG/ACT nasal spray Place 2 sprays into both nostrils daily for 7 days. Patient not taking: Reported on 09/16/2018 05/23/18 05/30/18  Raylene Everts, MD    Family History History reviewed. No pertinent family history.  Social History Social History   Tobacco Use  . Smoking status: Never Smoker  . Smokeless tobacco: Never Used  Substance Use Topics  . Alcohol use: No  . Drug use: No     Allergies   Patient has no known allergies.   Review of Systems Review of Systems   Constitutional: Negative for fever.  Respiratory: Negative for shortness of breath.   Cardiovascular: Negative for chest pain.  Gastrointestinal: Positive for nausea and vomiting. Negative for abdominal pain and diarrhea.  All other systems reviewed and are negative.    Physical Exam Updated Vital Signs BP 125/69 (BP Location: Left Arm)   Pulse 80   Temp 98.5 F (36.9 C) (Oral)   Resp 18   Ht 6\' 2"  (1.88 m)   Wt 81.6 kg   SpO2 100%   BMI 23.11 kg/m   Physical Exam Vitals signs and nursing note reviewed.  Constitutional:      General: He is not in acute distress.    Appearance: He is well-developed. He is not ill-appearing or diaphoretic.  HENT:     Head: Normocephalic and atraumatic.     Right Ear: External ear normal.     Left Ear: External ear normal.     Nose: Nose normal.  Eyes:     General:        Right eye: No discharge.        Left eye: No discharge.  Neck:     Musculoskeletal: Neck supple.  Cardiovascular:     Rate and Rhythm: Normal rate and regular rhythm.     Heart sounds: Normal heart sounds.  Pulmonary:     Effort: Pulmonary effort is normal.     Breath sounds: Normal breath sounds.  Chest:  Chest wall: No tenderness.  Abdominal:     General: There is no distension.     Palpations: Abdomen is soft.     Tenderness: There is no abdominal tenderness.  Skin:    General: Skin is warm and dry.  Neurological:     Mental Status: He is alert.  Psychiatric:        Mood and Affect: Mood is not anxious.      ED Treatments / Results  Labs (all labs ordered are listed, but only abnormal results are displayed) Labs Reviewed  COMPREHENSIVE METABOLIC PANEL  CBC WITH DIFFERENTIAL/PLATELET    EKG None  Radiology No results found.  Procedures Procedures (including critical care time)  Medications Ordered in ED Medications  metoCLOPramide (REGLAN) injection 10 mg (10 mg Intravenous Given 09/16/18 1216)  lactated ringers bolus 1,000 mL (0 mLs  Intravenous Stopped 09/16/18 1313)     Initial Impression / Assessment and Plan / ED Course  I have reviewed the triage vital signs and the nursing notes.  Pertinent labs & imaging results that were available during my care of the patient were reviewed by me and considered in my medical decision making (see chart for details).        Patient's vitals and exam are benign.  He feels much better after IV fluids and Reglan.  No more nausea.  He would like to be discharged.  I think this is reasonable and have offered antiemetics which she has declined.  There is no abdominal pain so I highly doubt acute intra-abdominal emergency.  Final Clinical Impressions(s) / ED Diagnoses   Final diagnoses:  Nausea and vomiting in adult    ED Discharge Orders    None       Pricilla LovelessGoldston, Emmalin Jaquess, MD 09/16/18 1337

## 2018-09-16 NOTE — ED Triage Notes (Signed)
Pt reports vomiting x48 hours.  Denies any pain at this time.  Reports getting sick at a residential facility.  He is currently living with family now.

## 2018-09-16 NOTE — ED Notes (Signed)
Patient verbalizes understanding of discharge instructions. Opportunity for questioning and answers were provided. Armband removed by staff, pt discharged from ED.  

## 2019-02-21 ENCOUNTER — Other Ambulatory Visit: Payer: Self-pay

## 2019-02-21 ENCOUNTER — Encounter: Payer: Self-pay | Admitting: Emergency Medicine

## 2019-02-21 ENCOUNTER — Ambulatory Visit
Admission: EM | Admit: 2019-02-21 | Discharge: 2019-02-21 | Disposition: A | Discharge status: Home / Self Care | Payer: Worker's Compensation | Attending: Family Medicine | Admitting: Family Medicine

## 2019-02-21 DIAGNOSIS — M545 Low back pain: Secondary | ICD-10-CM

## 2019-02-21 DIAGNOSIS — S46819A Strain of other muscles, fascia and tendons at shoulder and upper arm level, unspecified arm, initial encounter: Secondary | ICD-10-CM

## 2019-02-21 DIAGNOSIS — X500XXA Overexertion from strenuous movement or load, initial encounter: Secondary | ICD-10-CM

## 2019-02-21 DIAGNOSIS — S29019A Strain of muscle and tendon of unspecified wall of thorax, initial encounter: Secondary | ICD-10-CM

## 2019-02-21 MED ORDER — CYCLOBENZAPRINE HCL 10 MG PO TABS: 10.0000 mg | ORAL_TABLET | Freq: Two times a day (BID) | ORAL | 0 refills | Status: DC | PRN

## 2019-02-21 MED ORDER — KETOROLAC TROMETHAMINE 60 MG/2ML IM SOLN
60.0000 mg | Freq: Once | INTRAMUSCULAR | Status: AC
  Administered 2019-02-21: 11:00:00 60 mg via INTRAMUSCULAR

## 2019-02-21 MED ORDER — MELOXICAM 15 MG PO TABS: 15.0000 mg | ORAL_TABLET | Freq: Every day | ORAL | 0 refills | Status: DC | PRN

## 2019-02-21 NOTE — ED Triage Notes (Signed)
Pt c/o thoracic back pain. Pain is mainly in between his shoulder blades and radiates into his neck and shoulders. 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

## 2019-02-21 NOTE — ED Provider Notes (Signed)
[facility] ____________________________________________  Time seen: Approximately 11:21 AM  I have reviewed the triage vital signs and the nursing notes.   HISTORY  Chief Complaint Back Pain (W/C)   HPI Jerry Fischer is a 33 y.o. male presenting for evaluation of Worker's Compensation injury to upper back.  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  States Saturday he did lot of lifting and noticed he had some tightness to his back.  States he had to continue to work Saturday Sunday and Monday.  Stated yesterday pain had increased and he was unable to complete work.  Patient states he has taken over-the-counter ibuprofen without resolution.  Denies any fall, direct injury or direct trauma.  States pain is worse with movement but tightness at the base.  States pain is in his middle of his back and going towards his neck and shoulders.  Denies paresthesias, pain radiation, chest pain or shortness of breath, abdominal pain, fevers or recent sickness.   History reviewed. No pertinent past medical history.  There are no active problems to display for this patient.   History reviewed. No pertinent surgical history.   No current facility-administered medications for this encounter.   Current Outpatient Medications:    cyclobenzaprine (FLEXERIL) 10 MG tablet, Take 1 tablet (10 mg total) by mouth 2 (two) times daily as needed for muscle spasms. Do not drive while taking as can cause drowsiness, Disp: 15 tablet, Rfl: 0   meloxicam (MOBIC) 15 MG tablet, Take 1 tablet (15 mg total) by mouth daily as needed., Disp: 10 tablet, Rfl: 0  Allergies Patient has no known allergies.  Family History  Problem Relation Age of Onset   Healthy Mother    Healthy Father     Social History Social History   Tobacco Use   Smoking status: Current Every Day Smoker    Packs/day: 0.25    Types: Cigarettes   Smokeless tobacco: Never Used    Substance Use Topics   Alcohol use: No   Drug use: No    Review of Systems Constitutional: No fever. ENT: No sore throat. Cardiovascular: Denies chest pain. Respiratory: Denies shortness of breath. Gastrointestinal: No abdominal pain.   Musculoskeletal: Positive for back pain. Skin: Negative for rash. Neurological: Negative for focal weakness or numbness.   ____________________________________________   PHYSICAL EXAM:  VITAL SIGNS: ED Triage Vitals  Enc Vitals Group     BP 02/21/19 1048 124/69     Pulse Rate 02/21/19 1048 62     Resp 02/21/19 1048 18     Temp 02/21/19 1048 98.4 F (36.9 C)     Temp Source 02/21/19 1048 Oral     SpO2 02/21/19 1048 100 %     Weight 02/21/19 1043 190 lb (86.2 kg)     Height 02/21/19 1043 6\' 2"  (1.88 m)     Head Circumference --      Peak Flow --      Pain Score 02/21/19 1042 7     Pain Loc --      Pain Edu? --      Excl. in GC? --     Constitutional: Alert and oriented. Well appearing and in no acute distress. Eyes: Conjunctivae are normal.  ENT      Head: Normocephalic and atraumatic. Cardiovascular: Normal rate, regular rhythm. Grossly normal heart sounds.  Good peripheral circulation. Respiratory: Normal respiratory effort without tachypnea nor retractions. Breath sounds are clear and equal bilaterally. No  wheezes, rales, rhonchi. Musculoskeletal: Steady gait.  Changes positions quickly in room.  No lumbar tenderness palpation.  Minimal midline lower cervical and upper thoracic tenderness palpation.  Right para thoracic moderate tenderness to direct palpation as well as tenderness to bilateral trapezius, right greater than left, able to fully flex cervical spine and extend as well as rotate, able to fully abduct bilateral shoulders and upper extremities, no paresthesias. Neurologic:  Normal speech and language. No gross focal neurologic deficits are appreciated. Speech is normal. No gait instability.  Skin:  Skin is warm, dry and  intact. No rash noted. Psychiatric: Mood and affect are normal. Speech and behavior are normal. Patient exhibits appropriate insight and judgment   ___________________________________________   LABS (all labs ordered are listed, but only abnormal results are displayed)  Labs Reviewed - No data to display ____________________________________________  PROCEDURES Procedures   INITIAL IMPRESSION / ASSESSMENT AND PLAN / ED COURSE  Pertinent labs & imaging results that were available during my care of the patient were reviewed by me and considered in my medical decision making (see chart for details).  Well-appearing patient.  No acute distress.  Suspect muscular strain injuries.  No direct trauma, will defer x-ray imaging at this time, patient agrees.  60 mg IM Toradol given once in urgent care.  Will treat with Mobic and Flexeril.  Discussed no heavy lifting greater than 10 pounds for 3 days.  Follow-up with occupational health as needed for continued pain.Discussed indication, risks and benefits of medications with patient.   Discussed follow up with Primary care physician this week. Discussed follow up and return parameters including no resolution or any worsening concerns. Patient verbalized understanding and agreed to plan.   ____________________________________________   FINAL CLINICAL IMPRESSION(S) / ED DIAGNOSES  Final diagnoses:  Thoracic myofascial strain, initial encounter  Strain of trapezius muscle, unspecified laterality, initial encounter     ED Discharge Orders         Ordered    cyclobenzaprine (FLEXERIL) 10 MG tablet  2 times daily PRN     02/21/19 1102    meloxicam (MOBIC) 15 MG tablet  Daily PRN     02/21/19 1102           Note: This dictation was prepared with Dragon dictation along with smaller phrase technology. Any transcriptional errors that result from this process are unintentional.         Marylene Land, NP 02/21/19 1201

## 2019-02-21 NOTE — Discharge Instructions (Signed)
Take medication as prescribed. Rest. Drink plenty of fluids.  ° °Follow up with your primary care physician this week as needed. Return to Urgent care for new or worsening concerns.  ° °

## 2019-02-27 ENCOUNTER — Other Ambulatory Visit: Payer: Self-pay

## 2019-02-27 DIAGNOSIS — Z20822 Contact with and (suspected) exposure to covid-19: Secondary | ICD-10-CM

## 2019-02-27 NOTE — Addendum Note (Signed)
Addended by: Alan Mulder on: 02/27/2019 11:09 AM   Modules accepted: Orders

## 2019-03-27 ENCOUNTER — Ambulatory Visit: Payer: Self-pay | Attending: Internal Medicine

## 2019-03-27 DIAGNOSIS — Z20822 Contact with and (suspected) exposure to covid-19: Secondary | ICD-10-CM | POA: Insufficient documentation

## 2019-03-28 ENCOUNTER — Other Ambulatory Visit: Payer: Self-pay

## 2019-03-29 LAB — NOVEL CORONAVIRUS, NAA: SARS-CoV-2, NAA: NOT DETECTED

## 2019-04-06 ENCOUNTER — Ambulatory Visit: Payer: Self-pay | Attending: Internal Medicine

## 2019-04-06 DIAGNOSIS — Z20822 Contact with and (suspected) exposure to covid-19: Secondary | ICD-10-CM

## 2019-04-07 LAB — NOVEL CORONAVIRUS, NAA: SARS-CoV-2, NAA: NOT DETECTED

## 2019-06-21 ENCOUNTER — Other Ambulatory Visit: Payer: Self-pay

## 2019-06-21 ENCOUNTER — Encounter (HOSPITAL_COMMUNITY): Payer: Self-pay

## 2019-06-21 ENCOUNTER — Ambulatory Visit (HOSPITAL_COMMUNITY)
Admission: EM | Admit: 2019-06-21 | Discharge: 2019-06-21 | Disposition: A | Discharge status: Home / Self Care | Payer: HRSA Program | Attending: Family Medicine | Admitting: Family Medicine

## 2019-06-21 DIAGNOSIS — R0981 Nasal congestion: Secondary | ICD-10-CM | POA: Insufficient documentation

## 2019-06-21 DIAGNOSIS — F17210 Nicotine dependence, cigarettes, uncomplicated: Secondary | ICD-10-CM | POA: Insufficient documentation

## 2019-06-21 DIAGNOSIS — R05 Cough: Secondary | ICD-10-CM

## 2019-06-21 DIAGNOSIS — R059 Cough, unspecified: Secondary | ICD-10-CM

## 2019-06-21 DIAGNOSIS — Z20822 Contact with and (suspected) exposure to covid-19: Secondary | ICD-10-CM | POA: Diagnosis not present

## 2019-06-21 MED ORDER — HYDROCODONE-HOMATROPINE 5-1.5 MG/5ML PO SYRP: 5.0000 mL | ORAL_SOLUTION | Freq: Four times a day (QID) | ORAL | 0 refills | Status: DC | PRN

## 2019-06-21 NOTE — Discharge Instructions (Signed)
Be aware, your cough medication may cause drowsiness. Please do not drive, operate heavy machinery or make important decisions while on this medication, it can cloud your judgement.  You have been tested for COVID-19 today. If your test returns positive, you will receive a phone call from  regarding your results. Negative test results are not called. Both positive and negative results area always visible on MyChart. If you do not have a MyChart account, sign up instructions are provided in your discharge papers. Please do not hesitate to contact us should you have questions or concerns.  

## 2019-06-21 NOTE — ED Triage Notes (Signed)
Pt c/o chest and nasal congestion, productive cough w/bown sputum, yellowish-brown drainage from nosex2 days. Pt denies SOB. Pt has tried OTC allergy meds, but it's not helping. Pt has non labored breathing. Skin color WNL.

## 2019-06-21 NOTE — ED Provider Notes (Signed)
Jerry Fischer   [id number] 06/21/19 Arrival Time: 2458  ASSESSMENT & PLAN:  1. Nasal congestion   2. Cough     No s/s of bacterial infection. Discussed. COVID-19 testing sent. See letter/work note on file for self-isolation guidelines. OTC Afrin; limit use.  Meds ordered this encounter  Medications  . HYDROcodone-homatropine (HYCODAN) 5-1.5 MG/5ML syrup    Sig: Take 5 mLs by mouth every 6 (six) hours as needed for cough.    Dispense:  90 mL    Refill:  0    Follow-up Information    [facility].   Specialty: Urgent Care Why: If worsening or failing to improve as anticipated. Contact information: [place] [phone]          [place] Controlled Substances Registry consulted for this patient. I feel the risk/benefit ratio today is favorable for proceeding with this prescription for a controlled substance. Medication sedation precautions given.  Reviewed expectations re: course of current medical issues. Questions answered. Outlined signs and symptoms indicating need for more acute intervention. Understanding verbalized. After Visit Summary given.   SUBJECTIVE: History from: patient. Jerry Fischer is a 34 y.o. male who reports significant nasal congestion and productive cough; "yellow stuff"; fairly abrupt onset 2-3 days ago. Requests COVID-19 testing. Known COVID-19 contact: none. Recent travel: none. Denies: fever, sore throat, difficulty breathing and headache. Normal PO intake without n/v/d.    OBJECTIVE:  Vitals:   06/21/19 1500 06/21/19 1501  BP: 140/77   Pulse: 98   Resp: 18   Temp: 98.3 F (36.8 C)   TempSrc: Oral   SpO2: 97%   Weight:  81.6 kg  Height:  6\' 2"  (1.88 m)    General appearance: alert; no distress Eyes: PERRLA; EOMI; conjunctiva normal HENT: [place]; AT; nasal mucosa normal; oral mucosa normal Neck: supple  Lungs: speaks full sentences without difficulty;  unlabored; CTAB; no wheezing Extremities: no edema Skin: warm and dry Neurologic: normal gait Psychological: alert and cooperative; normal mood and affect   Labs:  Labs Reviewed  SARS CORONAVIRUS 2 (TAT 6-24 HRS)     No Known Allergies  History reviewed. No pertinent past medical history.   Social History   Socioeconomic History  . Marital status: Single    Spouse name: Not on file  . Number of children: Not on file  . Years of education: Not on file  . Highest education level: Not on file  Occupational History  . Not on file  Tobacco Use  . Smoking status: Current Every Day Smoker    Packs/day: 0.25    Types: Cigarettes  . Smokeless tobacco: Never Used  Substance and Sexual Activity  . Alcohol use: No  . Drug use: No  . Sexual activity: Not on file  Other Topics Concern  . Not on file  Social History Narrative  . Not on file   Social Determinants of Health   Financial Resource Strain:   . Difficulty of Paying Living Expenses:   Food Insecurity:   . Worried About Charity fundraiser in the Last Year:   . Arboriculturist in the Last Year:   Transportation Needs:   . Film/video editor (Medical):   Marland Kitchen Lack of Transportation (Non-Medical):   Physical Activity:   . Days of Exercise per Week:   . Minutes of Exercise per Session:   Stress:   . Feeling of Stress :   Social  Connections:   . Frequency of Communication with Friends and Family:   . Frequency of Social Gatherings with Friends and Family:   . Attends Religious Services:   . Active Member of Clubs or Organizations:   . Attends Banker Meetings:   Marland Kitchen Marital Status:   Intimate Partner Violence:   . Fear of Current or Ex-Partner:   . Emotionally Abused:   Marland Kitchen Physically Abused:   . Sexually Abused:    Family History  Problem Relation Age of Onset  . Healthy Mother   . Healthy Father    History reviewed. No pertinent surgical history.   Mardella Layman, MD 06/21/19 [phone]

## 2019-06-22 LAB — SARS CORONAVIRUS 2 (TAT 6-24 HRS): SARS Coronavirus 2: NEGATIVE

## 2019-06-27 ENCOUNTER — Emergency Department (HOSPITAL_COMMUNITY)
Admission: EM | Admit: 2019-06-27 | Discharge: 2019-06-27 | Disposition: A | Discharge status: Home / Self Care | Payer: Self-pay | Attending: Emergency Medicine | Admitting: Emergency Medicine

## 2019-06-27 ENCOUNTER — Emergency Department (HOSPITAL_COMMUNITY): Payer: Self-pay

## 2019-06-27 ENCOUNTER — Other Ambulatory Visit: Payer: Self-pay

## 2019-06-27 ENCOUNTER — Encounter (HOSPITAL_COMMUNITY): Payer: Self-pay | Admitting: Emergency Medicine

## 2019-06-27 DIAGNOSIS — J0100 Acute maxillary sinusitis, unspecified: Secondary | ICD-10-CM | POA: Insufficient documentation

## 2019-06-27 DIAGNOSIS — F17210 Nicotine dependence, cigarettes, uncomplicated: Secondary | ICD-10-CM | POA: Insufficient documentation

## 2019-06-27 NOTE — Discharge Instructions (Addendum)
Continue to use Zyrtec, Flonase and homemade Nettie pot to help with your sinus pressure and congestion. Follow-up with ear nose and throat if no improvement in 1 week. Tylenol as needed every 4 hrs for fevers and pain. Please try and quit smoking.

## 2019-06-27 NOTE — ED Provider Notes (Signed)
[facility] EMERGENCY DEPARTMENT Provider Note   CSN: [id number] Arrival date & time: 06/27/19  0849     History Chief Complaint  Patient presents with  . Nasal Congestion    Jerry Fischer is a 34 y.o. male.  Patient presents with for the past week.  No improvement despite trying over-the-counter medications and Flonase.  Covid test negative recently.  No fevers or chills.  Mild blood in the drainage.  Patient is a cigarette smoker.        History reviewed. No pertinent past medical history.  There are no problems to display for this patient.   History reviewed. No pertinent surgical history.     Family History  Problem Relation Age of Onset  . Healthy Mother   . Healthy Father     Social History   Tobacco Use  . Smoking status: Current Every Day Smoker    Packs/day: 0.25    Types: Cigarettes  . Smokeless tobacco: Never Used  Substance Use Topics  . Alcohol use: No  . Drug use: No    Home Medications Prior to Admission medications   Medication Sig Start Date End Date Taking? Authorizing Provider  HYDROcodone-homatropine (HYCODAN) 5-1.5 MG/5ML syrup Take 5 mLs by mouth every 6 (six) hours as needed for cough. 06/21/19   Vanessa Kick, MD  fluticasone (FLONASE) 50 MCG/ACT nasal spray Place 2 sprays into both nostrils daily for 7 days. Patient not taking: Reported on 09/16/2018 05/23/18 02/21/19  Raylene Everts, MD    Allergies    Patient has no known allergies.  Review of Systems   Review of Systems  Constitutional: Negative for chills and fever.  HENT: Positive for congestion, rhinorrhea and sinus pressure.   Eyes: Negative for visual disturbance.  Respiratory: Positive for cough. Negative for shortness of breath.   Cardiovascular: Negative for chest pain.  Gastrointestinal: Negative for abdominal pain and vomiting.  Genitourinary: Negative for dysuria and flank pain.  Musculoskeletal: Negative for back pain, neck pain and neck  stiffness.  Skin: Negative for rash.  Neurological: Negative for light-headedness and headaches.    Physical Exam Updated Vital Signs BP 134/88 (BP Location: Left Arm)   Pulse 97   Temp 98.2 F (36.8 C) (Oral)   Resp 20   SpO2 97%   Physical Exam Vitals and nursing note reviewed.  Constitutional:      Appearance: He is well-developed.  HENT:     Head: Normocephalic and atraumatic.     Comments: Maxillary sinus congestion and pressure     Nose: Congestion and rhinorrhea present.     Mouth/Throat:     Mouth: Mucous membranes are moist.  Eyes:     General:        Right eye: No discharge.        Left eye: No discharge.     Conjunctiva/sclera: Conjunctivae normal.  Neck:     Trachea: No tracheal deviation.  Cardiovascular:     Rate and Rhythm: Normal rate and regular rhythm.  Pulmonary:     Effort: Pulmonary effort is normal.     Breath sounds: Normal breath sounds.  Abdominal:     General: There is no distension.     Palpations: Abdomen is soft.     Tenderness: There is no abdominal tenderness. There is no guarding.  Musculoskeletal:     Cervical back: Normal range of motion and neck supple.  Skin:    General: Skin is warm.     Findings:  No rash.  Neurological:     Mental Status: He is alert and oriented to person, place, and time.     ED Results / Procedures / Treatments   Labs (all labs ordered are listed, but only abnormal results are displayed) Labs Reviewed - No data to display  EKG None  Radiology DG Chest 2 View  Result Date: 06/27/2019 CLINICAL DATA:  Cough and congestion EXAM: CHEST - 2 VIEW COMPARISON:  03/24/2012 FINDINGS: The heart size and mediastinal contours are within normal limits. Both lungs are clear. The visualized skeletal structures are unremarkable. IMPRESSION: No active cardiopulmonary disease. Electronically Signed   By: Alcide Clever M.D.   On: 06/27/2019 09:39    Procedures Procedures (including critical care time)  Medications  Ordered in ED Medications - No data to display  ED Course  I have reviewed the triage vital signs and the nursing notes.  Pertinent labs & imaging results that were available during my care of the patient were reviewed by me and considered in my medical decision making (see chart for details).    MDM Rules/Calculators/A&P                     Patient presents with recurrent maxillary sinus congestion. Discussed cigarette smoking cessation. Discussed homemade Nettie pot and other supportive care measures. Chest x-ray was performed and reviewed no acute infiltrate or abnormality.  Final Clinical Impression(s) / ED Diagnoses Final diagnoses:  Acute maxillary sinusitis, recurrence not specified    Rx / DC Orders ED Discharge Orders    None       Blane Ohara, MD 06/27/19 1044

## 2019-06-27 NOTE — ED Triage Notes (Signed)
Pt had covid test on 4/1 due to head congestion, nasal drainage. Reports being clogged up and no relief from nasal sprays. Covid test was negative.

## 2019-11-07 ENCOUNTER — Encounter (HOSPITAL_COMMUNITY): Payer: Self-pay | Admitting: Orthopedic Surgery

## 2019-11-07 ENCOUNTER — Other Ambulatory Visit: Payer: Self-pay

## 2019-11-07 NOTE — H&P (Signed)
[facility]thopaedic Trauma [facility] (OTS) Consult   Patient ID: Jerry Fischer MRN: [id number] DOB/AGE: February 25, 1986 34 y.o.    HPI: Jerry Fischer is an 34 y.o. male who sustained a complex fracture dislocation to his R ankle along with syndesmotic disruption after a loaded vending machine fell on him. He was seen at a hospital in [place] where reduction was attempted. He was splinted and referred to an orthopaedist in [place]. Due to the complexity of the injury he was referred to OTS.  Pt seen and evaluated in the office. He is in fairly severe pain, norco not helping. Splint was partially spilt in office to eval soft tissue. Extensive swelling with medial fracture blisters noted. Extensive swelling and ecchymosis to ankle and foot noted. Discussed treatment options with pt. xrays in office show persistent dislocation of talus with disrupted syndesmosis, possible 5th MTT fracture base noted. Pt presents for external fixation of R ankle   Past Medical History:  Diagnosis Date  . Fracture    tibia and fibula, right  . Pneumonia     Past Surgical History:  Procedure Laterality Date  . TONSILLECTOMY    . WISDOM TOOTH EXTRACTION      Family History  Problem Relation Age of Onset  . Healthy Mother   . Healthy Father     Social History:  reports that he has been smoking cigarettes. He has been smoking about 0.25 packs per day. He has never used smokeless tobacco. He reports that he does not drink alcohol and does not use drugs.  Allergies: No Known Allergies  Medications: I have reviewed the patient's current medications. Current Meds  Medication Sig  . HYDROcodone-acetaminophen (NORCO/VICODIN) 5-325 MG tablet Take 2 tablets by mouth every 6 (six) hours as needed for pain.  Marland Kitchen ibuprofen (ADVIL) 200 MG tablet Take 400-600 mg by mouth every 6 (six) hours as needed for moderate pain.     No results found for this or any previous visit (from the past 48 hour(s)).  No results  found.  Review of Systems  Constitutional: Negative for chills and fever.  Eyes: Negative for blurred vision and double vision.  Respiratory: Negative for cough.   Cardiovascular: Negative for chest pain.  Gastrointestinal: Negative for nausea and vomiting.  Musculoskeletal: Positive for joint pain (R ankle pain ).  Neurological: Negative for sensory change.   There were no vitals taken for this visit. Physical Exam Constitutional:      General: He is not in acute distress. HENT:     Head: Normocephalic and atraumatic.     Mouth/Throat:     Mouth: Mucous membranes are moist.  Eyes:     Pupils: Pupils are equal, round, and reactive to light.  Cardiovascular:     Rate and Rhythm: Normal rate and regular rhythm.  Pulmonary:     Effort: Pulmonary effort is normal. No respiratory distress.  Abdominal:     General: Abdomen is flat. There is no distension.     Palpations: Abdomen is soft.     Tenderness: There is no abdominal tenderness.  Musculoskeletal:     Comments: Right Lower Extremity  SLS intact  Partially split, splint not completely removed Extensive swelling and ecchymosis to R ankle and foot No wrinkling of skin with gentle compression  Large fracture blister medial ankle  + dp pulse DPN, SPN, TN sensation intact EHL, FHL, lesser toe motor intact No pain out of proportion with passive stretch  No  knee pain, no proximal fibular pain  No hip pain  No acute findings to thigh and hip    Skin:    General: Skin is warm.     Capillary Refill: Capillary refill takes less than 2 seconds.  Neurological:     Mental Status: He is alert.    Assessment/Plan:  34 y/o male with complex R ankle fracture-dislocation with syndesmotic disruption   -R ankle fracture dislocation, syndesmotic disruption with persistent dislocation   OR for Ex fix due to swelling and soft tissue injury   Return in 10-14 days for definitive fixation   Admit overnight for observation, pain  control, therapies   Ice and elevate   Risks and benefits reviewed with pt, wishes to proceed  - Pain management:  Pre-op block ok   Titrate post op   - Medical issues   No chronic issues  - DVT/PE prophylaxis:  ASA post op  - ID:   periop abx  - Metabolic Bone Disease:  Check vitamin d levels and optimize if necessary  - Activity:  NWB R leg x 8 weeks after definitive fixation   - FEN/GI prophylaxis/Foley/Lines:  Npo   Advance diet post op    - Dispo:  OR for ex fix R ankle fracture dislocation      Mearl Latin, PA-C [phone] (C) 11/07/2019, 11:47 PM  [facility] [address] [place] [address] [phone] Collier Bullock (F)

## 2019-11-07 NOTE — Anesthesia Preprocedure Evaluation (Addendum)
Anesthesia Evaluation  Patient identified by MRN, date of birth, ID band Patient awake    Reviewed: Allergy & Precautions, H&P , NPO status , Patient's Chart, lab work & pertinent test results  Airway Mallampati: II  TM Distance: >3 FB Neck ROM: Full    Dental no notable dental hx. (+) Teeth Intact, Dental Advisory Given   Pulmonary Current Smoker and Patient abstained from smoking.,    Pulmonary exam normal breath sounds clear to auscultation       Cardiovascular Exercise Tolerance: Good negative cardio ROS   Rhythm:Regular Rate:Normal     Neuro/Psych negative neurological ROS  negative psych ROS   GI/Hepatic negative GI ROS, Neg liver ROS,   Endo/Other  negative endocrine ROS  Renal/GU negative Renal ROS  negative genitourinary   Musculoskeletal   Abdominal   Peds  Hematology negative hematology ROS (+)   Anesthesia Other Findings   Reproductive/Obstetrics negative OB ROS                            Anesthesia Physical Anesthesia Plan  ASA: II  Anesthesia Plan: General   Post-op Pain Management:  Regional for Post-op pain   Induction: Intravenous  PONV Risk Score and Plan: 2 and Ondansetron, Dexamethasone and Midazolam  Airway Management Planned: LMA  Additional Equipment:   Intra-op Plan:   Post-operative Plan: Extubation in OR  Informed Consent: I have reviewed the patients History and Physical, chart, labs and discussed the procedure including the risks, benefits and alternatives for the proposed anesthesia with the patient or authorized representative who has indicated his/her understanding and acceptance.     Dental advisory given  Plan Discussed with: CRNA  Anesthesia Plan Comments:        Anesthesia Quick Evaluation

## 2019-11-07 NOTE — Progress Notes (Signed)
Pt denies SOB, chest pain, and being under the care of a cardiologist. Pt denies having a PCP. Pt denies having a stress test, echo and cardiac cath. Pt denies having an EKG. Pt denies recent labs. Pt made aware to stop taking Aspirin (unless otherwise advised by surgeon), vitamins, fish oil and herbal medications. Do not take any NSAIDs ie: Ibuprofen, Advil, Naproxen (Aleve), Motrin, BC and Goody Powder. Pt reminded to quarantine. Pt verbalized understanding of all pre-op instructions.

## 2019-11-08 ENCOUNTER — Other Ambulatory Visit: Payer: Self-pay

## 2019-11-08 ENCOUNTER — Ambulatory Visit (HOSPITAL_COMMUNITY): Payer: Self-pay | Admitting: Anesthesiology

## 2019-11-08 ENCOUNTER — Ambulatory Visit (HOSPITAL_COMMUNITY)
Admission: RE | Admit: 2019-11-08 | Discharge: 2019-11-08 | Disposition: A | Discharge status: Home / Self Care | Payer: Self-pay | Attending: Orthopedic Surgery | Admitting: Orthopedic Surgery

## 2019-11-08 ENCOUNTER — Ambulatory Visit (HOSPITAL_COMMUNITY): Payer: Self-pay

## 2019-11-08 ENCOUNTER — Encounter (HOSPITAL_COMMUNITY): Payer: Self-pay | Admitting: Orthopedic Surgery

## 2019-11-08 ENCOUNTER — Encounter (HOSPITAL_COMMUNITY)
Admission: RE | Disposition: A | Discharge status: Home / Self Care | Payer: Self-pay | Source: Home / Self Care | Attending: Orthopedic Surgery

## 2019-11-08 DIAGNOSIS — W3189XA Contact with other specified machinery, initial encounter: Secondary | ICD-10-CM | POA: Insufficient documentation

## 2019-11-08 DIAGNOSIS — Z20822 Contact with and (suspected) exposure to covid-19: Secondary | ICD-10-CM | POA: Insufficient documentation

## 2019-11-08 DIAGNOSIS — S9701XA Crushing injury of right ankle, initial encounter: Secondary | ICD-10-CM | POA: Insufficient documentation

## 2019-11-08 DIAGNOSIS — T148XXA Other injury of unspecified body region, initial encounter: Secondary | ICD-10-CM

## 2019-11-08 DIAGNOSIS — S82851A Displaced trimalleolar fracture of right lower leg, initial encounter for closed fracture: Secondary | ICD-10-CM | POA: Insufficient documentation

## 2019-11-08 DIAGNOSIS — F17210 Nicotine dependence, cigarettes, uncomplicated: Secondary | ICD-10-CM | POA: Insufficient documentation

## 2019-11-08 DIAGNOSIS — Z419 Encounter for procedure for purposes other than remedying health state, unspecified: Secondary | ICD-10-CM

## 2019-11-08 DIAGNOSIS — S93431A Sprain of tibiofibular ligament of right ankle, initial encounter: Secondary | ICD-10-CM | POA: Insufficient documentation

## 2019-11-08 HISTORY — DX: Other injury of unspecified body region, initial encounter: T14.8XXA

## 2019-11-08 HISTORY — DX: Pneumonia, unspecified organism: J18.9

## 2019-11-08 HISTORY — PX: EXTERNAL FIXATION LEG: SHX1549

## 2019-11-08 LAB — CBC
HCT: 41 % (ref 39.0–52.0)
Hemoglobin: 13.4 g/dL (ref 13.0–17.0)
MCH: 31.8 pg (ref 26.0–34.0)
MCHC: 32.7 g/dL (ref 30.0–36.0)
MCV: 97.2 fL (ref 80.0–100.0)
Platelets: 247 10*3/uL (ref 150–400)
RBC: 4.22 MIL/uL (ref 4.22–5.81)
RDW: 12.4 % (ref 11.5–15.5)
WBC: 12.2 10*3/uL — ABNORMAL HIGH (ref 4.0–10.5)
nRBC: 0 % (ref 0.0–0.2)

## 2019-11-08 LAB — VITAMIN D 25 HYDROXY (VIT D DEFICIENCY, FRACTURES): Vit D, 25-Hydroxy: 21.41 ng/mL — ABNORMAL LOW (ref 30–100)

## 2019-11-08 LAB — SARS CORONAVIRUS 2 BY RT PCR (HOSPITAL ORDER, PERFORMED IN ~~LOC~~ HOSPITAL LAB): SARS Coronavirus 2: NEGATIVE

## 2019-11-08 SURGERY — EXTERNAL FIXATION, LOWER EXTREMITY
Anesthesia: General | Site: Ankle | Laterality: Right

## 2019-11-08 MED ORDER — HYDROMORPHONE HCL 1 MG/ML IJ SOLN
0.2500 mg | INTRAMUSCULAR | Status: DC | PRN
  Administered 2019-11-08 (×4): 0.5 mg via INTRAVENOUS

## 2019-11-08 MED ORDER — ONDANSETRON 4 MG PO TBDP: 4.0000 mg | ORAL_TABLET | Freq: Three times a day (TID) | ORAL | 0 refills | Status: DC | PRN

## 2019-11-08 MED ORDER — FENTANYL CITRATE (PF) 250 MCG/5ML IJ SOLN
INTRAMUSCULAR | Status: AC
  Filled 2019-11-08: qty 5

## 2019-11-08 MED ORDER — POVIDONE-IODINE 10 % EX SWAB
2.0000 "application " | Freq: Once | CUTANEOUS | Status: AC
  Administered 2019-11-08: 2 via TOPICAL

## 2019-11-08 MED ORDER — LIDOCAINE 2% (20 MG/ML) 5 ML SYRINGE
INTRAMUSCULAR | Status: DC | PRN
  Administered 2019-11-08: 60 mg via INTRAVENOUS

## 2019-11-08 MED ORDER — PROPOFOL 10 MG/ML IV BOLUS
INTRAVENOUS | Status: AC
  Filled 2019-11-08: qty 20

## 2019-11-08 MED ORDER — MIDAZOLAM HCL 2 MG/2ML IJ SOLN
INTRAMUSCULAR | Status: AC
  Filled 2019-11-08: qty 2

## 2019-11-08 MED ORDER — ACETAMINOPHEN 500 MG PO TABS
1000.0000 mg | ORAL_TABLET | Freq: Once | ORAL | Status: AC
  Administered 2019-11-08: 1000 mg via ORAL
  Filled 2019-11-08: qty 2

## 2019-11-08 MED ORDER — CELECOXIB 200 MG PO CAPS
400.0000 mg | ORAL_CAPSULE | Freq: Once | ORAL | Status: AC
  Administered 2019-11-08: 400 mg via ORAL
  Filled 2019-11-08: qty 2

## 2019-11-08 MED ORDER — 0.9 % SODIUM CHLORIDE (POUR BTL) OPTIME
TOPICAL | Status: DC | PRN
  Administered 2019-11-08: 1000 mL

## 2019-11-08 MED ORDER — PHENYLEPHRINE 40 MCG/ML (10ML) SYRINGE FOR IV PUSH (FOR BLOOD PRESSURE SUPPORT)
PREFILLED_SYRINGE | INTRAVENOUS | Status: DC | PRN
  Administered 2019-11-08: 80 ug via INTRAVENOUS
  Administered 2019-11-08 (×8): 40 ug via INTRAVENOUS

## 2019-11-08 MED ORDER — DEXAMETHASONE SODIUM PHOSPHATE 10 MG/ML IJ SOLN
INTRAMUSCULAR | Status: DC | PRN
  Administered 2019-11-08: 5 mg via INTRAVENOUS

## 2019-11-08 MED ORDER — HYDROMORPHONE HCL 2 MG PO TABS
ORAL_TABLET | ORAL | Status: AC
  Filled 2019-11-08: qty 2

## 2019-11-08 MED ORDER — LACTATED RINGERS IV SOLN: INTRAVENOUS | Status: DC | PRN

## 2019-11-08 MED ORDER — CHLORHEXIDINE GLUCONATE 4 % EX LIQD: 60.0000 mL | Freq: Once | CUTANEOUS | Status: DC

## 2019-11-08 MED ORDER — FENTANYL CITRATE (PF) 100 MCG/2ML IJ SOLN
INTRAMUSCULAR | Status: DC | PRN
  Administered 2019-11-08 (×3): 50 ug via INTRAVENOUS

## 2019-11-08 MED ORDER — KETOROLAC TROMETHAMINE 15 MG/ML IJ SOLN
INTRAMUSCULAR | Status: AC
  Filled 2019-11-08: qty 1

## 2019-11-08 MED ORDER — DEXMEDETOMIDINE (PRECEDEX) IN NS 20 MCG/5ML (4 MCG/ML) IV SYRINGE
PREFILLED_SYRINGE | INTRAVENOUS | Status: DC | PRN
  Administered 2019-11-08 (×3): 4 ug via INTRAVENOUS

## 2019-11-08 MED ORDER — PROPOFOL 10 MG/ML IV BOLUS
INTRAVENOUS | Status: DC | PRN
  Administered 2019-11-08: 200 mg via INTRAVENOUS

## 2019-11-08 MED ORDER — CHLORHEXIDINE GLUCONATE 0.12 % MT SOLN
OROMUCOSAL | Status: AC
  Administered 2019-11-08: 15 mL
  Filled 2019-11-08: qty 15

## 2019-11-08 MED ORDER — KETOROLAC TROMETHAMINE 15 MG/ML IJ SOLN
15.0000 mg | Freq: Once | INTRAMUSCULAR | Status: AC
  Administered 2019-11-08: 15 mg via INTRAVENOUS

## 2019-11-08 MED ORDER — APIXABAN 2.5 MG PO TABS: 2.5000 mg | ORAL_TABLET | Freq: Two times a day (BID) | ORAL | 0 refills | Status: DC

## 2019-11-08 MED ORDER — HYDROMORPHONE HCL 1 MG/ML IJ SOLN
INTRAMUSCULAR | Status: AC
  Filled 2019-11-08: qty 1

## 2019-11-08 MED ORDER — GABAPENTIN 300 MG PO CAPS
300.0000 mg | ORAL_CAPSULE | Freq: Once | ORAL | Status: AC
  Administered 2019-11-08: 300 mg via ORAL
  Filled 2019-11-08: qty 1

## 2019-11-08 MED ORDER — CEFAZOLIN SODIUM-DEXTROSE 2-4 GM/100ML-% IV SOLN
2.0000 g | INTRAVENOUS | Status: AC
  Administered 2019-11-08: 2 g via INTRAVENOUS
  Filled 2019-11-08: qty 100

## 2019-11-08 MED ORDER — VITAMIN D 125 MCG (5000 UT) PO CAPS: 1.0000 | ORAL_CAPSULE | Freq: Every day | ORAL | 2 refills | Status: AC

## 2019-11-08 MED ORDER — HYDROMORPHONE HCL 2 MG PO TABS
4.0000 mg | ORAL_TABLET | Freq: Once | ORAL | Status: AC
  Administered 2019-11-08: 4 mg via ORAL

## 2019-11-08 MED ORDER — LIDOCAINE 2% (20 MG/ML) 5 ML SYRINGE
INTRAMUSCULAR | Status: AC
  Filled 2019-11-08: qty 5

## 2019-11-08 MED ORDER — VITAMIN C 1000 MG PO TABS: 1000.0000 mg | ORAL_TABLET | Freq: Every day | ORAL | 0 refills | Status: AC

## 2019-11-08 MED ORDER — MIDAZOLAM HCL 5 MG/5ML IJ SOLN
INTRAMUSCULAR | Status: DC | PRN
  Administered 2019-11-08: 2 mg via INTRAVENOUS

## 2019-11-08 MED ORDER — PHENYLEPHRINE 40 MCG/ML (10ML) SYRINGE FOR IV PUSH (FOR BLOOD PRESSURE SUPPORT)
PREFILLED_SYRINGE | INTRAVENOUS | Status: AC
  Filled 2019-11-08: qty 10

## 2019-11-08 MED ORDER — ONDANSETRON HCL 4 MG/2ML IJ SOLN
INTRAMUSCULAR | Status: DC | PRN
  Administered 2019-11-08: 4 mg via INTRAVENOUS

## 2019-11-08 MED FILL — ONDANSETRON ODT 4 MG TABLET: 4 | 6 days supply | Qty: 20 | Fill #0

## 2019-11-08 MED FILL — ELIQUIS 2.5 MG TABLET: 2.5 | 30 days supply | Qty: 60 | Fill #0

## 2019-11-08 MED FILL — VITAMIN D3 5,000 UNIT TAB: 125 MCG | 30 days supply | Qty: 30 | Fill #0

## 2019-11-08 MED FILL — VITAMIN C 500 MG TABLET: 500 | 30 days supply | Qty: 60 | Fill #0

## 2019-11-08 SURGICAL SUPPLY — 72 items
BANDAGE ESMARK 6X9 LF (GAUZE/BANDAGES/DRESSINGS) ×1 IMPLANT
BAR EXFX 150X11 NS LF (EXFIX) ×2
BAR EXFX 400X11 NS LF (EXFIX) ×2
BAR GLASS FIBER EXFX 11X150 (EXFIX) ×4 IMPLANT
BAR GLASS FIBER EXFX 11X400 (EXFIX) ×4 IMPLANT
BNDG CMPR 9X6 STRL LF SNTH (GAUZE/BANDAGES/DRESSINGS) ×1
BNDG ELASTIC 3X5.8 VLCR STR LF (GAUZE/BANDAGES/DRESSINGS) ×2 IMPLANT
BNDG ELASTIC 4X5.8 VLCR STR LF (GAUZE/BANDAGES/DRESSINGS) IMPLANT
BNDG ELASTIC 6X5.8 VLCR STR LF (GAUZE/BANDAGES/DRESSINGS) IMPLANT
BNDG ESMARK 6X9 LF (GAUZE/BANDAGES/DRESSINGS) ×3
BNDG GAUZE ELAST 4 BULKY (GAUZE/BANDAGES/DRESSINGS) ×4 IMPLANT
BRUSH SCRUB EZ PLAIN DRY (MISCELLANEOUS) ×6 IMPLANT
CAP PROTECTIVE TRANSFX 4.5X5MM (EXFIX) ×2 IMPLANT
CLAMP BLUE BAR TO BAR (EXFIX) ×4 IMPLANT
CLAMP BLUE BAR TO PIN (EXFIX) ×8 IMPLANT
COVER MAYO STAND STRL (DRAPES) ×3 IMPLANT
COVER SURGICAL LIGHT HANDLE (MISCELLANEOUS) ×3 IMPLANT
COVER WAND RF STERILE (DRAPES) ×3 IMPLANT
CUFF TOURN SGL QUICK 34 (TOURNIQUET CUFF) ×3
CUFF TRNQT CYL 34X4.125X (TOURNIQUET CUFF) ×1 IMPLANT
DRAPE C-ARM 42X72 X-RAY (DRAPES) ×3 IMPLANT
DRAPE C-ARMOR (DRAPES) ×3 IMPLANT
DRAPE HALF SHEET 40X57 (DRAPES) ×3 IMPLANT
DRAPE U-SHAPE 47X51 STRL (DRAPES) ×3 IMPLANT
DRSG EMULSION OIL 3X3 NADH (GAUZE/BANDAGES/DRESSINGS) IMPLANT
ELECT REM PT RETURN 9FT ADLT (ELECTROSURGICAL) ×3
ELECTRODE REM PT RTRN 9FT ADLT (ELECTROSURGICAL) ×1 IMPLANT
GAUZE SPONGE 4X4 12PLY STRL (GAUZE/BANDAGES/DRESSINGS) ×6 IMPLANT
GAUZE SPONGE 4X4 16PLY XRAY LF (GAUZE/BANDAGES/DRESSINGS) ×2 IMPLANT
GAUZE XEROFORM 5X9 LF (GAUZE/BANDAGES/DRESSINGS) ×2 IMPLANT
GLOVE BIO SURGEON STRL SZ7.5 (GLOVE) ×3 IMPLANT
GLOVE BIO SURGEON STRL SZ8 (GLOVE) ×3 IMPLANT
GLOVE BIOGEL PI IND STRL 7.5 (GLOVE) ×1 IMPLANT
GLOVE BIOGEL PI IND STRL 8 (GLOVE) ×1 IMPLANT
GLOVE BIOGEL PI INDICATOR 7.5 (GLOVE) ×2
GLOVE BIOGEL PI INDICATOR 8 (GLOVE) ×2
GOWN STRL REUS W/ TWL LRG LVL3 (GOWN DISPOSABLE) ×2 IMPLANT
GOWN STRL REUS W/ TWL XL LVL3 (GOWN DISPOSABLE) ×1 IMPLANT
GOWN STRL REUS W/TWL LRG LVL3 (GOWN DISPOSABLE) ×6
GOWN STRL REUS W/TWL XL LVL3 (GOWN DISPOSABLE) ×3
KIT BASIN OR (CUSTOM PROCEDURE TRAY) ×3 IMPLANT
KIT TURNOVER KIT B (KITS) ×3 IMPLANT
MANIFOLD NEPTUNE II (INSTRUMENTS) ×3 IMPLANT
NDL HYPO 21X1.5 SAFETY (NEEDLE) IMPLANT
NEEDLE HYPO 21X1.5 SAFETY (NEEDLE) IMPLANT
NS IRRIG 1000ML POUR BTL (IV SOLUTION) ×3 IMPLANT
PACK GENERAL/GYN (CUSTOM PROCEDURE TRAY) ×3 IMPLANT
PACK ORTHO EXTREMITY (CUSTOM PROCEDURE TRAY) ×3 IMPLANT
PAD ARMBOARD 7.5X6 YLW CONV (MISCELLANEOUS) ×6 IMPLANT
PAD CAST 4YDX4 CTTN HI CHSV (CAST SUPPLIES) IMPLANT
PADDING CAST ABS 3INX4YD NS (CAST SUPPLIES) ×2
PADDING CAST ABS COTTON 3X4 (CAST SUPPLIES) IMPLANT
PADDING CAST COTTON 4X4 STRL (CAST SUPPLIES)
PADDING CAST COTTON 6X4 STRL (CAST SUPPLIES) IMPLANT
PIN 3MM (EXFIX) ×4 IMPLANT
PIN CLAMP 2BAR 75MM BLUE (EXFIX) ×2 IMPLANT
PIN HALF YELLOW 5X160X35 (EXFIX) ×4 IMPLANT
PIN TRANSFIXING 5.0 (EXFIX) ×2 IMPLANT
SPONGE LAP 18X18 RF (DISPOSABLE) ×3 IMPLANT
STAPLER VISISTAT 35W (STAPLE) IMPLANT
SUCTION FRAZIER HANDLE 10FR (MISCELLANEOUS) ×3
SUCTION TUBE FRAZIER 10FR DISP (MISCELLANEOUS) ×1 IMPLANT
SUT ETHILON 3 0 PS 1 (SUTURE) ×6 IMPLANT
SUT PDS AB 2-0 CT1 27 (SUTURE) IMPLANT
SUT VIC AB 2-0 CT1 27 (SUTURE) ×6
SUT VIC AB 2-0 CT1 TAPERPNT 27 (SUTURE) ×2 IMPLANT
TOWEL GREEN STERILE (TOWEL DISPOSABLE) ×6 IMPLANT
TOWEL GREEN STERILE FF (TOWEL DISPOSABLE) ×3 IMPLANT
TUBE CONNECTING 12'X1/4 (SUCTIONS) ×1
TUBE CONNECTING 12X1/4 (SUCTIONS) ×2 IMPLANT
UNDERPAD 30X36 HEAVY ABSORB (UNDERPADS AND DIAPERS) ×3 IMPLANT
WATER STERILE IRR 1000ML POUR (IV SOLUTION) ×3 IMPLANT

## 2019-11-08 NOTE — Discharge Instructions (Addendum)
General Anesthesia, Adult, Care After This sheet gives you information about how to care for yourself after your procedure. Your health care provider may also give you more specific instructions. If you have problems or questions, contact your health care provider. What can I expect after the procedure? After the procedure, the following side effects are common:  Pain or discomfort at the IV site.  Nausea.  Vomiting.  Sore throat.  Trouble concentrating.  Feeling cold or chills.  Weak or tired.  Sleepiness and fatigue.  Soreness and body aches. These side effects can affect parts of the body that were not involved in surgery. Follow these instructions at home:  For at least 24 hours after the procedure:  Have a responsible adult stay with you. It is important to have someone help care for you until you are awake and alert.  Rest as needed.  Do not: ? Participate in activities in which you could fall or become injured. ? Drive. ? Use heavy machinery. ? Drink alcohol. ? Take sleeping pills or medicines that cause drowsiness. ? Make important decisions or sign legal documents. ? Take care of children on your own. Eating and drinking  Follow any instructions from your health care provider about eating or drinking restrictions.  When you feel hungry, start by eating small amounts of foods that are soft and easy to digest (bland), such as toast. Gradually return to your regular diet.  Drink enough fluid to keep your urine pale yellow.  If you vomit, rehydrate by drinking water, juice, or clear broth. General instructions  If you have sleep apnea, surgery and certain medicines can increase your risk for breathing problems. Follow instructions from your health care provider about wearing your sleep device: ? Anytime you are sleeping, including during daytime naps. ? While taking prescription pain medicines, sleeping medicines, or medicines that make you drowsy.  Return to  your normal activities as told by your health care provider. Ask your health care provider what activities are safe for you.  Take over-the-counter and prescription medicines only as told by your health care provider.  If you smoke, do not smoke without supervision.  Keep all follow-up visits as told by your health care provider. This is important. Contact a health care provider if:  You have nausea or vomiting that does not get better with medicine.  You cannot eat or drink without vomiting.  You have pain that does not get better with medicine.  You are unable to pass urine.  You develop a skin rash.  You have a fever.  You have redness around your IV site that gets worse. Get help right away if:  You have difficulty breathing.  You have chest pain.  You have blood in your urine or stool, or you vomit blood. Summary  After the procedure, it is common to have a sore throat or nausea. It is also common to feel tired.  Have a responsible adult stay with you for the first 24 hours after general anesthesia. It is important to have someone help care for you until you are awake and alert.  When you feel hungry, start by eating small amounts of foods that are soft and easy to digest (bland), such as toast. Gradually return to your regular diet.  Drink enough fluid to keep your urine pale yellow.  Return to your normal activities as told by your health care provider. Ask your health care provider what activities are safe for you. This information is not   intended to replace advice given to you by your health care provider. Make sure you discuss any questions you have with your health care provider. Document Revised: 03/11/2017 Document Reviewed: 10/22/2016 Elsevier Patient Education  2020 [employer].   [facility] Discharge Instructions   General Discharge Instructions  Orthopaedic Injuries:  Right ankle fracture dislocation treated temporarily with  external fixator  WEIGHT BEARING STATUS:  Nonweightbearing Right leg   RANGE OF MOTION/ACTIVITY: unrestricted range of motion of toes and knee   Bone health:  Take vitamin d and vitamin c that were prescribed for you   Wound Care: can remove dressings on R leg starting on 11/11/2019. See instructions below  Discharge Pin Site Instructions  Dress pins daily with Kerlix roll starting on POD 2. Wrap the Kerlix so that it tamps the skin down around the pin-skin interface to prevent/limit motion of the skin relative to the pin.  (Pin-skin motion is the primary cause of pain and infection related to external fixator pin sites).  Remove any crust or coagulum that may obstruct drainage with soap and water.  After POD 3, if there is no discernable drainage on the pin site dressing, the interval for change can by increased to every other day.  You may shower with the fixator, cleaning all pin sites gently with soap and water.  If you have a surgical wound this needs to be completely dry and without drainage before showering. Alternatively you can use a washcloth with soap and water and gently clean the injured extremity and external fixator, including all pinsites and surgical wounds   The extremity can be lifted by the fixator to facilitate wound care and transfers.  Notify the office/Doctor if you experience increasing drainage, redness, or pain from a pin site, or if you notice purulent (thick, snot-like) drainage.  You can place a nonstick layer over the blisters on the inside of your ankle to prevent the dressings from sticking to the skin. The nonstick layer you had on initially is called mepitel.  This can be obtained at [facility] supply in [place]. You can order it online as well.  You will need kerlix rolled gauze for around the pinsites and the ace wraps can be reused.    DVT/PE prophylaxis: eliquis 2.5 mg two x a day for 4 weeks   Diet: as you were eating previously.  Can use over  the counter stool softeners and bowel preparations, such as Miralax, to help with bowel movements.  Narcotics can be constipating.  Be sure to drink plenty of fluids  PAIN MEDICATION USE AND EXPECTATIONS  You have likely been given narcotic medications to help control your pain.  After a traumatic event that results in an fracture (broken bone) with or without surgery, it is ok to use narcotic pain medications to help control one's pain.  We understand that everyone responds to pain differently and each individual patient will be evaluated on a regular basis for the continued need for narcotic medications. Ideally, narcotic medication use should last no more than 6-8 weeks (coinciding with fracture healing).   As a patient it is your responsibility as well to monitor narcotic medication use and report the amount and frequency you use these medications when you come to your office visit.   We would also advise that if you are using narcotic medications, you should take a dose prior to therapy to maximize you participation.  IF YOU ARE ON NARCOTIC MEDICATIONS IT IS NOT PERMISSIBLE TO  OPERATE A MOTOR VEHICLE (MOTORCYCLE/CAR/TRUCK/MOPED) OR HEAVY MACHINERY DO NOT MIX NARCOTICS WITH OTHER CNS (CENTRAL NERVOUS SYSTEM) DEPRESSANTS SUCH AS ALCOHOL   STOP SMOKING OR USING NICOTINE PRODUCTS!!!!  As discussed nicotine severely impairs your body's ability to heal surgical and traumatic wounds but also impairs bone healing.  Wounds and bone heal by forming microscopic blood vessels (angiogenesis) and nicotine is a vasoconstrictor (essentially, shrinks blood vessels).  Therefore, if vasoconstriction occurs to these microscopic blood vessels they essentially disappear and are unable to deliver necessary nutrients to the healing tissue.  This is one modifiable factor that you can do to dramatically increase your chances of healing your injury.    (This means no smoking, no nicotine gum, patches, etc)  DO NOT USE  NONSTEROIDAL ANTI-INFLAMMATORY DRUGS (NSAID'S)  Using products such as Advil (ibuprofen), Aleve (naproxen), Motrin (ibuprofen) for additional pain control during fracture healing can delay and/or prevent the healing response.  If you would like to take over the counter (OTC) medication, Tylenol (acetaminophen) is ok.  However, some narcotic medications that are given for pain control contain acetaminophen as well. Therefore, you should not exceed more than 4000 mg of tylenol in a day if you do not have liver disease.  Also note that there are may OTC medicines, such as cold medicines and allergy medicines that my contain tylenol as well.  If you have any questions about medications and/or interactions please ask your doctor/PA or your pharmacist.      ICE AND ELEVATE INJURED/OPERATIVE EXTREMITY  Using ice and elevating the injured extremity above your heart can help with swelling and pain control.  Icing in a pulsatile fashion, such as 20 minutes on and 20 minutes off, can be followed.    Do not place ice directly on skin. Make sure there is a barrier between to skin and the ice pack.    Using frozen items such as frozen peas works well as the conform nicely to the are that needs to be iced.  USE AN ACE WRAP OR TED HOSE FOR SWELLING CONTROL  In addition to icing and elevation, Ace wraps or TED hose are used to help limit and resolve swelling.  It is recommended to use Ace wraps or TED hose until you are informed to stop.    When using Ace Wraps start the wrapping distally (farthest away from the body) and wrap proximally (closer to the body)   Example: If you had surgery on your leg or thing and you do not have a splint on, start the ace wrap at the toes and work your way up to the thigh        If you had surgery on your upper extremity and do not have a splint on, start the ace wrap at your fingers and work your way up to the upper arm  IF YOU ARE IN A SPLINT OR CAST DO NOT REMOVE IT FOR ANY  REASON   If your splint gets wet for any reason please contact the office immediately. You may shower in your splint or cast as long as you keep it dry.  This can be done by wrapping in a cast cover or garbage back (or similar)  Do Not stick any thing down your splint or cast such as pencils, money, or hangers to try and scratch yourself with.  If you feel itchy take benadryl as prescribed on the bottle for itching  IF YOU ARE IN A CAM BOOT (BLACK BOOT)  You may  remove boot periodically. Perform daily dressing changes as noted below.  Wash the liner of the boot regularly and wear a sock when wearing the boot. It is recommended that you sleep in the boot until told otherwise    Call office for the following:  Temperature greater than 101F  Persistent nausea and vomiting  Severe uncontrolled pain  Redness, tenderness, or signs of infection (pain, swelling, redness, odor or green/yellow discharge around the site)  Difficulty breathing, headache or visual disturbances  Hives  Persistent dizziness or light-headedness  Extreme fatigue  Any other questions or concerns you may have after discharge  In an emergency, call 911 or go to an Emergency Department at a nearby hospital  HELPFUL INFORMATION  ? If you had a block, it will wear off between 8-24 hrs postop typically.  This is period when your pain may go from nearly zero to the pain you would have had postop without the block.  This is an abrupt transition but nothing dangerous is happening.  You may take an extra dose of narcotic when this happens.  ? You should wean off your narcotic medicines as soon as you are able.  Most patients will be off or using minimal narcotics before their first postop appointment.   ? We suggest you use the pain medication the first night prior to going to bed, in order to ease any pain when the anesthesia wears off. You should avoid taking pain medications on an empty stomach as it will make you  nauseous.  ? Do not drink alcoholic beverages or take illicit drugs when taking pain medications.  ? In most states it is against the law to drive while you are in a splint or sling.  And certainly against the law to drive while taking narcotics.  ? You may return to work/school in the next couple of days when you feel up to it.   ? Pain medication may make you constipated.  Below are a few solutions to try in this order: - Decrease the amount of pain medication if you aren't having pain. - Drink lots of decaffeinated fluids. - Drink prune juice and/or each dried prunes  o If the first 3 don't work start with additional solutions - Take Colace - an over-the-counter stool softener - Take Senokot - an over-the-counter laxative - Take Miralax - a stronger over-the-counter laxative     CALL THE OFFICE WITH ANY QUESTIONS OR CONCERNS: [phone]   VISIT OUR WEBSITE FOR ADDITIONAL INFORMATION: [email or web address]

## 2019-11-08 NOTE — Transfer of Care (Signed)
Immediate Anesthesia Transfer of Care Note  Patient: Jerry Fischer  Procedure(s) Performed: EXTERNAL FIXATION LEG (Right Ankle)  Patient Location: PACU  Anesthesia Type:General  Level of Consciousness: drowsy  Airway & Oxygen Therapy: Patient Spontanous Breathing and Patient connected to face mask oxygen  Post-op Assessment: Report given to RN and Post -op Vital signs reviewed and stable  Post vital signs: Reviewed and stable  Last Vitals:  Vitals Value Taken Time  BP 107/62 11/08/19 1001  Temp    Pulse 76 11/08/19 1002  Resp 11 11/08/19 1002  SpO2 97 % 11/08/19 1002  Vitals shown include unvalidated device data.  Last Pain:  Vitals:   11/08/19 0718  PainSc: 9       Patients Stated Pain Goal: 3 (11/08/19 0718)  Complications: No complications documented.

## 2019-11-08 NOTE — Anesthesia Procedure Notes (Signed)
Procedure Name: LMA Insertion Date/Time: 11/08/2019 8:29 AM Performed by: Marny Lowenstein, CRNA Pre-anesthesia Checklist: Patient identified, Emergency Drugs available, Suction available and Patient being monitored Patient Re-evaluated:Patient Re-evaluated prior to induction Oxygen Delivery Method: Circle system utilized Preoxygenation: Pre-oxygenation with 100% oxygen Induction Type: IV induction Ventilation: Mask ventilation without difficulty LMA: LMA inserted LMA Size: 5.0 Number of attempts: 1 Airway Equipment and Method: Patient positioned with wedge pillow Placement Confirmation: positive ETCO2 and breath sounds checked- equal and bilateral Tube secured with: Tape Dental Injury: Teeth and Oropharynx as per pre-operative assessment

## 2019-11-08 NOTE — Anesthesia Postprocedure Evaluation (Signed)
Anesthesia Post Note  Patient: Jerry Fischer  Procedure(s) Performed: EXTERNAL FIXATION LEG (Right Ankle)     Patient location during evaluation: PACU Anesthesia Type: General Level of consciousness: awake and alert Pain management: pain level controlled Vital Signs Assessment: post-procedure vital signs reviewed and stable Respiratory status: spontaneous breathing, nonlabored ventilation and respiratory function stable Cardiovascular status: blood pressure returned to baseline and stable Postop Assessment: no apparent nausea or vomiting Anesthetic complications: no   No complications documented.  Last Vitals:  Vitals:   11/08/19 1030 11/08/19 1045  BP: 128/69 140/71  Pulse: 91 77  Resp: 17 12  Temp:  37 C  SpO2: 97% 97%                 Victora Irby,W. EDMOND

## 2019-11-08 NOTE — Progress Notes (Signed)
Pt came to this RN from phase 1 to phase 2 for DC-- pt had to wait for Rx to be brought from pharmacy. Pt stated he was impatient and wanted to leave and be DC'd from PACU and return to Rx later. RN wheeled pt out in wheelchair to mother and father. Mother aware that Rx are going to be at hospital and need to be picked up later. Main PACU number given to mother to call when ready to pick up Rx.

## 2019-11-08 NOTE — Op Note (Signed)
NAME: Jerry Fischer MEDICAL RECORD XH:[id number] DATE OF BIRTH:[birth year] FACILITY: [facility] LOCATION: [facility] PHYSICIAN:Masaji Billups H. Zahraa Bhargava, MD  OPERATIVE REPORT  DATE OF PROCEDURE: 11/06/2019  PREOPERATIVE DIAGNOSES:   1. RIGHT TRIMALLEOLAR FRACTURE 2. RIGHT ANKLE DISLOCATION. 3. RIGHT ANKLE SYNDESMOTIC DISRUPTION.  POSTOPERATIVE DIAGNOSES:  4. RIGHT TRIMALLEOLAR FRACTURE 5. RIGHT ANKLE DISLOCATION. 6. RIGHT ANKLE SYNDESMOTIC DISRUPTION.  PROCEDURES: 1.  CLOSED REDUCTION OF RIGHT ANKLE DISLOCATION 2.  CLOSED REDUCTION OF RIGHT TRIMALLEOLAR FRACTURE  3.  APPLICATION OF MULTIPLANAR SPANNING EXTERNAL FIXATION INCLUDING THE FOOT AND ANKLE  SURGEON:  Myrene Galas, MD  ASSISTANT:  Montez Morita PA-C.  ANESTHESIA:  General.  COMPLICATIONS:  None.  ESTIMATED BLOOD LOSS:  Scant.  DISPOSITION:  To PACU.  CONDITION:  Stable.  BRIEF SUMMARY FOR PROCEDURE: Patient is a 34 y.o. who sustained severe injury to the right ankle in a crushing injury from a vending machine. Given the degree of skin injury, instability, and displacement urgent reduction and external fixation is indicated to reduce risks of complications including deep infection which could lead to limb loss. The risks and benefits of surgery were discussed with the patient including nerve injury, vessel injury, specifically the aforementioned elevated risk of skin breakdown and infection which could result in limb loss. We also discussed DVT, PE, loss of motion, arthritis, the need to stage surgery, and others.  After acknowledging these risks, consent was provided to proceed.  SUMMARY OF PROCEDURE:  The patient was taken to the operating room where general anesthesia was induced after receiving preoperative antibiotics.  The operative lower extremity was prepped and draped in the usual sterile fashion, following chlorhexidene wash and then betadine scrub and paint. We began with placement of 2 pins in the tibia followed by a  transcalcaneal pin, checking position and length with a C-arm. I then performed a closed reduction of the ankle joint with the help of my assistant who held the distal tibia still with lateral force while I applied a lateral force to the talus to reduce it anatomically beneath the tibia.  We then dialed in reduction of the trimalleolar fracture and this reduction was held while the bars were secured to the clamps.  In a separate plane oriented at roughly 90 degrees from the first, two additional pins were then applied into the first and fifth metatarsal shafts, bringing the foot up into a plantigrade position and securing the two constructs with bar-to-bar clamp fixation.    Final AP, lateral and mortise images were obtained showing reduction of the ankle joint and also of the fractured malleoli.  The leg was cleaned thoroughly and pins wrapped with a Kerlix and then Mepitel over the fracture blisters and a gentle compressive Kerlix and Ace wrap for the foot. The patient was taken to the PACU in stable condition.    Montez Morita, PA-C, was present and assisting throughout.  An assistant was necessary for achieving and maintaining reduction during application of the fixator and K wires.  PROGNOSIS:  Ice, elevation, and toe motion has been prescribed to assist with resolution of soft tissue swelling. Plan to return to the OR for definitive ORIF when soft tissues allow. Formal DVT prophylaxis with Lovenox. Soap and water with Kerlix dressings for pin site care. Compliance with NWB will be necessary to obtain optimal results and noncompliance could be catastrophic, which has been discussed with the patient.

## 2019-11-09 ENCOUNTER — Encounter (HOSPITAL_COMMUNITY): Payer: Self-pay | Admitting: Orthopedic Surgery

## 2019-11-28 ENCOUNTER — Encounter (HOSPITAL_COMMUNITY): Payer: Self-pay | Admitting: Orthopedic Surgery

## 2019-11-28 NOTE — Progress Notes (Signed)
PCP - [facility] Cardiologist -  n/a  Chest x-ray - 06/27/19 (2V) EKG - n/a Stress Test - n/a ECHO - n/a Cardiac Cath - n/a  Blood Thinner Instructions:  Follow your surgeon's instructions on when to stop Eliquis prior to surgery.  Clear liquids til 10:30 am per anesthesia protocol, no MD orders in Epic but requested.  STOP now taking any Aspirin (unless otherwise instructed by your surgeon), Aleve, Naproxen, Ibuprofen, Motrin, Advil, Goody's, BC's, all herbal medications, fish oil, and all vitamins.   Coronavirus Screening Covid test on DOS. Do you have any of the following symptoms:  Cough yes/no: No Fever (>100.39F)  yes/no: No Runny nose yes/no: No Sore throat yes/no: No Difficulty breathing/shortness of breath  yes/no: No  Have you traveled in the last 14 days and where? yes/no: No  Patient verbalized understanding of instructions that were given via phone.

## 2019-11-29 ENCOUNTER — Ambulatory Visit (HOSPITAL_COMMUNITY): Payer: Self-pay

## 2019-11-29 ENCOUNTER — Encounter (HOSPITAL_COMMUNITY)
Admission: RE | Disposition: A | Discharge status: Home / Self Care | Payer: Self-pay | Source: Home / Self Care | Attending: Orthopedic Surgery

## 2019-11-29 ENCOUNTER — Ambulatory Visit (HOSPITAL_COMMUNITY): Payer: Self-pay | Admitting: Anesthesiology

## 2019-11-29 ENCOUNTER — Other Ambulatory Visit: Payer: Self-pay

## 2019-11-29 ENCOUNTER — Encounter (HOSPITAL_COMMUNITY): Payer: Self-pay | Admitting: Orthopedic Surgery

## 2019-11-29 ENCOUNTER — Ambulatory Visit (HOSPITAL_COMMUNITY)
Admission: RE | Admit: 2019-11-29 | Discharge: 2019-11-29 | Disposition: A | Discharge status: Home / Self Care | Payer: Self-pay | Attending: Orthopedic Surgery | Admitting: Orthopedic Surgery

## 2019-11-29 DIAGNOSIS — F17200 Nicotine dependence, unspecified, uncomplicated: Secondary | ICD-10-CM | POA: Insufficient documentation

## 2019-11-29 DIAGNOSIS — Z9889 Other specified postprocedural states: Secondary | ICD-10-CM

## 2019-11-29 DIAGNOSIS — S82851D Displaced trimalleolar fracture of right lower leg, subsequent encounter for closed fracture with routine healing: Secondary | ICD-10-CM | POA: Insufficient documentation

## 2019-11-29 DIAGNOSIS — X58XXXD Exposure to other specified factors, subsequent encounter: Secondary | ICD-10-CM | POA: Insufficient documentation

## 2019-11-29 DIAGNOSIS — Z419 Encounter for procedure for purposes other than remedying health state, unspecified: Secondary | ICD-10-CM

## 2019-11-29 DIAGNOSIS — T148XXA Other injury of unspecified body region, initial encounter: Secondary | ICD-10-CM

## 2019-11-29 DIAGNOSIS — S93431D Sprain of tibiofibular ligament of right ankle, subsequent encounter: Secondary | ICD-10-CM | POA: Insufficient documentation

## 2019-11-29 DIAGNOSIS — Z20822 Contact with and (suspected) exposure to covid-19: Secondary | ICD-10-CM | POA: Insufficient documentation

## 2019-11-29 DIAGNOSIS — Z8781 Personal history of (healed) traumatic fracture: Secondary | ICD-10-CM

## 2019-11-29 HISTORY — PX: ORIF ANKLE FRACTURE: SHX5408

## 2019-11-29 HISTORY — PX: EXTERNAL FIXATION LEG: SHX1549

## 2019-11-29 LAB — CBC WITH DIFFERENTIAL/PLATELET
Abs Immature Granulocytes: 0.01 10*3/uL (ref 0.00–0.07)
Basophils Absolute: 0.1 10*3/uL (ref 0.0–0.1)
Basophils Relative: 1 %
Eosinophils Absolute: 0.3 10*3/uL (ref 0.0–0.5)
Eosinophils Relative: 4 %
HCT: 50.2 % (ref 39.0–52.0)
Hemoglobin: 16 g/dL (ref 13.0–17.0)
Immature Granulocytes: 0 %
Lymphocytes Relative: 30 %
Lymphs Abs: 1.9 10*3/uL (ref 0.7–4.0)
MCH: 30.3 pg (ref 26.0–34.0)
MCHC: 31.9 g/dL (ref 30.0–36.0)
MCV: 95.1 fL (ref 80.0–100.0)
Monocytes Absolute: 0.7 10*3/uL (ref 0.1–1.0)
Monocytes Relative: 12 %
Neutro Abs: 3.3 10*3/uL (ref 1.7–7.7)
Neutrophils Relative %: 53 %
Platelets: 299 10*3/uL (ref 150–400)
RBC: 5.28 MIL/uL (ref 4.22–5.81)
RDW: 12.5 % (ref 11.5–15.5)
WBC: 6.2 10*3/uL (ref 4.0–10.5)
nRBC: 0 % (ref 0.0–0.2)

## 2019-11-29 LAB — SARS CORONAVIRUS 2 BY RT PCR (HOSPITAL ORDER, PERFORMED IN ~~LOC~~ HOSPITAL LAB): SARS Coronavirus 2: NEGATIVE

## 2019-11-29 SURGERY — OPEN REDUCTION INTERNAL FIXATION (ORIF) ANKLE FRACTURE
Anesthesia: General | Site: Ankle | Laterality: Right

## 2019-11-29 MED ORDER — CHLORHEXIDINE GLUCONATE 4 % EX LIQD: 60.0000 mL | Freq: Once | CUTANEOUS | Status: DC

## 2019-11-29 MED ORDER — FENTANYL CITRATE (PF) 100 MCG/2ML IJ SOLN
INTRAMUSCULAR | Status: DC | PRN
Start: 2019-11-29 — End: 2019-11-29
  Administered 2019-11-29: 100 ug via INTRAVENOUS

## 2019-11-29 MED ORDER — ONDANSETRON HCL 4 MG/2ML IJ SOLN
INTRAMUSCULAR | Status: AC
  Filled 2019-11-29: qty 2

## 2019-11-29 MED ORDER — MIDAZOLAM HCL 2 MG/2ML IJ SOLN: 2.0000 mg | Freq: Once | INTRAMUSCULAR | Status: AC

## 2019-11-29 MED ORDER — CEFAZOLIN SODIUM-DEXTROSE 2-4 GM/100ML-% IV SOLN
INTRAVENOUS | Status: AC
  Filled 2019-11-29: qty 100

## 2019-11-29 MED ORDER — FENTANYL CITRATE (PF) 100 MCG/2ML IJ SOLN
INTRAMUSCULAR | Status: AC
  Administered 2019-11-29: 100 ug via INTRAVENOUS
  Filled 2019-11-29: qty 2

## 2019-11-29 MED ORDER — LACTATED RINGERS IV SOLN: INTRAVENOUS | Status: DC

## 2019-11-29 MED ORDER — MIDAZOLAM HCL 5 MG/5ML IJ SOLN
INTRAMUSCULAR | Status: DC | PRN
  Administered 2019-11-29: 2 mg via INTRAVENOUS

## 2019-11-29 MED ORDER — PHENYLEPHRINE 40 MCG/ML (10ML) SYRINGE FOR IV PUSH (FOR BLOOD PRESSURE SUPPORT)
PREFILLED_SYRINGE | INTRAVENOUS | Status: DC | PRN
  Administered 2019-11-29: 80 ug via INTRAVENOUS
  Administered 2019-11-29: 60 ug via INTRAVENOUS
  Administered 2019-11-29: 80 ug via INTRAVENOUS
  Administered 2019-11-29: 60 ug via INTRAVENOUS
  Administered 2019-11-29: 80 ug via INTRAVENOUS

## 2019-11-29 MED ORDER — DEXAMETHASONE SODIUM PHOSPHATE 10 MG/ML IJ SOLN
INTRAMUSCULAR | Status: AC
  Filled 2019-11-29: qty 1

## 2019-11-29 MED ORDER — FENTANYL CITRATE (PF) 250 MCG/5ML IJ SOLN
INTRAMUSCULAR | Status: AC
  Filled 2019-11-29: qty 5

## 2019-11-29 MED ORDER — HYDROMORPHONE HCL 1 MG/ML IJ SOLN
INTRAMUSCULAR | Status: AC
  Filled 2019-11-29: qty 1

## 2019-11-29 MED ORDER — FENTANYL CITRATE (PF) 100 MCG/2ML IJ SOLN: 100.0000 ug | Freq: Once | INTRAMUSCULAR | Status: AC

## 2019-11-29 MED ORDER — SUGAMMADEX SODIUM 200 MG/2ML IV SOLN
INTRAVENOUS | Status: DC | PRN
  Administered 2019-11-29: 200 mg via INTRAVENOUS

## 2019-11-29 MED ORDER — BUPIVACAINE-EPINEPHRINE (PF) 0.5% -1:200000 IJ SOLN
INTRAMUSCULAR | Status: DC | PRN
  Administered 2019-11-29: 30 mL via PERINEURAL
  Administered 2019-11-29: 15 mL via PERINEURAL

## 2019-11-29 MED ORDER — CHLORHEXIDINE GLUCONATE 0.12 % MT SOLN
OROMUCOSAL | Status: AC
  Administered 2019-11-29: 15 mL via OROMUCOSAL
  Filled 2019-11-29: qty 15

## 2019-11-29 MED ORDER — ROCURONIUM BROMIDE 10 MG/ML (PF) SYRINGE
PREFILLED_SYRINGE | INTRAVENOUS | Status: DC | PRN
  Administered 2019-11-29: 40 mg via INTRAVENOUS
  Administered 2019-11-29: 60 mg via INTRAVENOUS

## 2019-11-29 MED ORDER — POVIDONE-IODINE 10 % EX SWAB: 2.0000 "application " | Freq: Once | CUTANEOUS | Status: DC

## 2019-11-29 MED ORDER — DEXAMETHASONE SODIUM PHOSPHATE 10 MG/ML IJ SOLN
INTRAMUSCULAR | Status: DC | PRN
  Administered 2019-11-29: 10 mg via INTRAVENOUS

## 2019-11-29 MED ORDER — CHLORHEXIDINE GLUCONATE 0.12 % MT SOLN: 15.0000 mL | OROMUCOSAL | Status: AC

## 2019-11-29 MED ORDER — LIDOCAINE 2% (20 MG/ML) 5 ML SYRINGE
INTRAMUSCULAR | Status: AC
  Filled 2019-11-29: qty 5

## 2019-11-29 MED ORDER — PROPOFOL 10 MG/ML IV BOLUS
INTRAVENOUS | Status: DC | PRN
  Administered 2019-11-29: 170 mg via INTRAVENOUS

## 2019-11-29 MED ORDER — DEXMEDETOMIDINE (PRECEDEX) IN NS 20 MCG/5ML (4 MCG/ML) IV SYRINGE
PREFILLED_SYRINGE | INTRAVENOUS | Status: AC
  Filled 2019-11-29: qty 10

## 2019-11-29 MED ORDER — MIDAZOLAM HCL 2 MG/2ML IJ SOLN
INTRAMUSCULAR | Status: AC
  Filled 2019-11-29: qty 2

## 2019-11-29 MED ORDER — DEXMEDETOMIDINE (PRECEDEX) IN NS 20 MCG/5ML (4 MCG/ML) IV SYRINGE
PREFILLED_SYRINGE | INTRAVENOUS | Status: DC | PRN
  Administered 2019-11-29: 12 ug via INTRAVENOUS
  Administered 2019-11-29: 8 ug via INTRAVENOUS

## 2019-11-29 MED ORDER — HYDROMORPHONE HCL 1 MG/ML IJ SOLN
0.2500 mg | INTRAMUSCULAR | Status: DC | PRN
  Administered 2019-11-29: 0.5 mg via INTRAVENOUS

## 2019-11-29 MED ORDER — LIDOCAINE 2% (20 MG/ML) 5 ML SYRINGE
INTRAMUSCULAR | Status: DC | PRN
  Administered 2019-11-29: 40 mg via INTRAVENOUS

## 2019-11-29 MED ORDER — MIDAZOLAM HCL 2 MG/2ML IJ SOLN
INTRAMUSCULAR | Status: AC
  Administered 2019-11-29: 2 mg via INTRAVENOUS
  Filled 2019-11-29: qty 2

## 2019-11-29 MED ORDER — ONDANSETRON HCL 4 MG/2ML IJ SOLN
INTRAMUSCULAR | Status: DC | PRN
  Administered 2019-11-29: 4 mg via INTRAVENOUS

## 2019-11-29 MED ORDER — ROCURONIUM BROMIDE 10 MG/ML (PF) SYRINGE
PREFILLED_SYRINGE | INTRAVENOUS | Status: AC
  Filled 2019-11-29: qty 10

## 2019-11-29 MED ORDER — CEFAZOLIN SODIUM-DEXTROSE 2-4 GM/100ML-% IV SOLN
2.0000 g | INTRAVENOUS | Status: AC
  Administered 2019-11-29: 2 g via INTRAVENOUS

## 2019-11-29 MED ORDER — 0.9 % SODIUM CHLORIDE (POUR BTL) OPTIME
TOPICAL | Status: DC | PRN
  Administered 2019-11-29: 1000 mL

## 2019-11-29 SURGICAL SUPPLY — 91 items
BANDAGE ESMARK 6X9 LF (GAUZE/BANDAGES/DRESSINGS) ×2 IMPLANT
BIT DRILL 2.4X140 LONG SOLID (BIT) ×2 IMPLANT
BIT DRILL 2.8 (BIT) ×3
BIT DRILL 2.8MM (BIT) ×1
BIT DRILL CANNULTD 2.6 X 130MM (DRILL) IMPLANT
BIT DRILL LNG 140X2.8XSLD (BIT) IMPLANT
BIT DRL LNG 140X2.8XSLD (BIT) ×2
BNDG CMPR 9X6 STRL LF SNTH (GAUZE/BANDAGES/DRESSINGS)
BNDG CMPR MED 10X6 ELC LF (GAUZE/BANDAGES/DRESSINGS) ×2
BNDG COHESIVE 6X5 TAN STRL LF (GAUZE/BANDAGES/DRESSINGS) IMPLANT
BNDG ELASTIC 4X5.8 VLCR STR LF (GAUZE/BANDAGES/DRESSINGS) ×4 IMPLANT
BNDG ELASTIC 6X10 VLCR STRL LF (GAUZE/BANDAGES/DRESSINGS) ×2 IMPLANT
BNDG ELASTIC 6X5.8 VLCR STR LF (GAUZE/BANDAGES/DRESSINGS) ×4 IMPLANT
BNDG ESMARK 6X9 LF (GAUZE/BANDAGES/DRESSINGS)
BNDG GAUZE ELAST 4 BULKY (GAUZE/BANDAGES/DRESSINGS) ×8 IMPLANT
BRUSH SCRUB EZ PLAIN DRY (MISCELLANEOUS) ×8 IMPLANT
CLOSURE WOUND 1/2 X4 (GAUZE/BANDAGES/DRESSINGS)
COVER MAYO STAND STRL (DRAPES) ×4 IMPLANT
COVER SURGICAL LIGHT HANDLE (MISCELLANEOUS) ×6 IMPLANT
COVER WAND RF STERILE (DRAPES) ×2 IMPLANT
CUFF TOURN SGL QUICK 18X4 (TOURNIQUET CUFF) IMPLANT
CUFF TOURN SGL QUICK 34 (TOURNIQUET CUFF) ×4
CUFF TRNQT CYL 34X4.125X (TOURNIQUET CUFF) ×2 IMPLANT
DRAPE C-ARM 42X72 X-RAY (DRAPES) ×4 IMPLANT
DRAPE C-ARMOR (DRAPES) ×4 IMPLANT
DRAPE HALF SHEET 40X57 (DRAPES) ×2 IMPLANT
DRAPE U-SHAPE 47X51 STRL (DRAPES) ×4 IMPLANT
DRILL CANNULATED 2.6 X 130MM (DRILL) ×4
DRSG ADAPTIC 3X8 NADH LF (GAUZE/BANDAGES/DRESSINGS) ×2 IMPLANT
DRSG EMULSION OIL 3X3 NADH (GAUZE/BANDAGES/DRESSINGS) ×2 IMPLANT
DRSG MEPITEL 4X7.2 (GAUZE/BANDAGES/DRESSINGS) IMPLANT
ELECT REM PT RETURN 9FT ADLT (ELECTROSURGICAL) ×4
ELECTRODE REM PT RTRN 9FT ADLT (ELECTROSURGICAL) ×2 IMPLANT
EVACUATOR 1/8 PVC DRAIN (DRAIN) IMPLANT
GAUZE SPONGE 4X4 12PLY STRL (GAUZE/BANDAGES/DRESSINGS) ×8 IMPLANT
GLOVE BIO SURGEON STRL SZ7.5 (GLOVE) ×4 IMPLANT
GLOVE BIO SURGEON STRL SZ8 (GLOVE) ×4 IMPLANT
GLOVE BIOGEL PI IND STRL 7.5 (GLOVE) ×2 IMPLANT
GLOVE BIOGEL PI IND STRL 8 (GLOVE) ×2 IMPLANT
GLOVE BIOGEL PI INDICATOR 7.5 (GLOVE) ×2
GLOVE BIOGEL PI INDICATOR 8 (GLOVE) ×2
GOWN STRL REUS W/ TWL LRG LVL3 (GOWN DISPOSABLE) ×4 IMPLANT
GOWN STRL REUS W/ TWL XL LVL3 (GOWN DISPOSABLE) ×2 IMPLANT
GOWN STRL REUS W/TWL LRG LVL3 (GOWN DISPOSABLE) ×8
GOWN STRL REUS W/TWL XL LVL3 (GOWN DISPOSABLE) ×4
K-WIRE SNGL END 1.2X150 (MISCELLANEOUS) ×12
KIT BASIN OR (CUSTOM PROCEDURE TRAY) ×4 IMPLANT
KIT TURNOVER KIT B (KITS) ×4 IMPLANT
KWIRE SNGL END 1.2X150 (MISCELLANEOUS) IMPLANT
MANIFOLD NEPTUNE II (INSTRUMENTS) ×4 IMPLANT
NDL HYPO 21X1.5 SAFETY (NEEDLE) IMPLANT
NEEDLE 22X1 1/2 (OR ONLY) (NEEDLE) IMPLANT
NEEDLE HYPO 21X1.5 SAFETY (NEEDLE) IMPLANT
NS IRRIG 1000ML POUR BTL (IV SOLUTION) ×4 IMPLANT
PACK GENERAL/GYN (CUSTOM PROCEDURE TRAY) ×2 IMPLANT
PACK ORTHO EXTREMITY (CUSTOM PROCEDURE TRAY) ×4 IMPLANT
PAD ARMBOARD 7.5X6 YLW CONV (MISCELLANEOUS) ×8 IMPLANT
PAD CAST 4YDX4 CTTN HI CHSV (CAST SUPPLIES) IMPLANT
PADDING CAST ABS 6INX4YD NS (CAST SUPPLIES) ×2
PADDING CAST ABS COTTON 6X4 NS (CAST SUPPLIES) IMPLANT
PADDING CAST COTTON 4X4 STRL (CAST SUPPLIES)
PADDING CAST COTTON 6X4 STRL (CAST SUPPLIES) ×8 IMPLANT
PLATE FIB ANAT 17H RT (Plate) ×2 IMPLANT
PREVENA RESTOR AXIOFORM 29X28 (GAUZE/BANDAGES/DRESSINGS) ×2 IMPLANT
SCREW CANN.SHORT HEAD 4.0X32MM (Screw) ×4 IMPLANT
SCREW LOCK PLATE R3 3.5X14 (Screw) ×2 IMPLANT
SCREW LOCK PLATE R3 3.5X16 (Screw) ×4 IMPLANT
SCREW NLOCK R3CON PLATE 4.2X55 (Screw) ×2 IMPLANT
SCREW NON LOCK 3.5X18 (Screw) ×2 IMPLANT
SCREW NON LOCKING 3.5X12 (Screw) ×2 IMPLANT
SCREW NON LOCKING 3.5X14 (Screw) ×8 IMPLANT
SCREW NONLOCK PL R3 4.2X48 (Screw) ×2 IMPLANT
SCREW NONLOCK PLATE R3 4.2X34 (Screw) ×2 IMPLANT
SPLINT PLASTER CAST XFAST 5X30 (CAST SUPPLIES) IMPLANT
SPLINT PLASTER XFAST SET 5X30 (CAST SUPPLIES) ×2
SPONGE LAP 18X18 RF (DISPOSABLE) ×4 IMPLANT
STAPLER VISISTAT 35W (STAPLE) IMPLANT
STOCKINETTE IMPERVIOUS LG (DRAPES) IMPLANT
STRIP CLOSURE SKIN 1/2X4 (GAUZE/BANDAGES/DRESSINGS) IMPLANT
SUCTION FRAZIER HANDLE 10FR (MISCELLANEOUS) ×4
SUCTION TUBE FRAZIER 10FR DISP (MISCELLANEOUS) ×2 IMPLANT
SUT ETHILON 3 0 PS 1 (SUTURE) ×4 IMPLANT
SUT PDS AB 2-0 CT1 27 (SUTURE) IMPLANT
SUT VIC AB 2-0 CT1 27 (SUTURE)
SUT VIC AB 2-0 CT1 TAPERPNT 27 (SUTURE) ×4 IMPLANT
TOWEL GREEN STERILE (TOWEL DISPOSABLE) ×8 IMPLANT
TOWEL GREEN STERILE FF (TOWEL DISPOSABLE) ×8 IMPLANT
TUBE CONNECTING 12'X1/4 (SUCTIONS) ×1
TUBE CONNECTING 12X1/4 (SUCTIONS) ×3 IMPLANT
UNDERPAD 30X36 HEAVY ABSORB (UNDERPADS AND DIAPERS) ×4 IMPLANT
WATER STERILE IRR 1000ML POUR (IV SOLUTION) ×2 IMPLANT

## 2019-11-29 NOTE — H&P (Signed)
Interval H&P  No changes in health since discharge from the hospital for external fixation on 11/08/19.  I discussed with the patient the risks and benefits of surgery for his right ankle, including the possibility of infection, nerve injury, vessel injury, wound breakdown, arthritis, symptomatic hardware, DVT/ PE, loss of motion, malunion, nonunion, and need for further surgery among others.  We also specifically discussed the possible need to keep the fixator in place.  He acknowledged these risks and wished to proceed.  Myrene Galas, MD [facility], [facility] [phone]

## 2019-11-29 NOTE — Discharge Instructions (Addendum)
[facility]thopa[facility] Trauma Service Discharge Instructions   General Discharge Instructions  Orthopaedic Injuries:  Right ankle fracture treated with open reduction and internal fixation using plates and screws   WEIGHT BEARING STATUS: Nonweightbearing right leg  RANGE OF MOTION/ACTIVITY: Activity as tolerated while maintaining weightbearing restriction. No ankle motion as you are in a splint.  Bone health: Continue with vitamin D supplementation  Wound Care: No wound care until follow-up. You have a Prevena which is a special vacuum dressing underneath your splint. Make sure that it is charged at all times. Okay to have unplug during the day would recommend plugging it in at night so the battery charges overnight. If there are any issues with the wound VAC please call the office  DVT/PE prophylaxis: Continue Eliquis until further notice  Diet: as you were eating previously.  Can use over the counter stool softeners and bowel preparations, such as Miralax, to help with bowel movements.  Narcotics can be constipating.  Be sure to drink plenty of fluids  PAIN MEDICATION USE AND EXPECTATIONS  You have likely been given narcotic medications to help control your pain.  After a traumatic event that results in an fracture (broken bone) with or without surgery, it is ok to use narcotic pain medications to help control one's pain.  We understand that everyone responds to pain differently and each individual patient will be evaluated on a regular basis for the continued need for narcotic medications. Ideally, narcotic medication use should last no more than 6-8 weeks (coinciding with fracture healing).   As a patient it is your responsibility as well to monitor narcotic medication use and report the amount and frequency you use these medications when you come to your office visit.   We would also advise that if you are using narcotic medications, you should take a dose prior to therapy to maximize you  participation.  IF YOU ARE ON NARCOTIC MEDICATIONS IT IS NOT PERMISSIBLE TO OPERATE A MOTOR VEHICLE (MOTORCYCLE/CAR/TRUCK/MOPED) OR HEAVY MACHINERY DO NOT MIX NARCOTICS WITH OTHER CNS (CENTRAL NERVOUS SYSTEM) DEPRESSANTS SUCH AS ALCOHOL   STOP SMOKING OR USING NICOTINE PRODUCTS!!!!  As discussed nicotine severely impairs your body's ability to heal surgical and traumatic wounds but also impairs bone healing.  Wounds and bone heal by forming microscopic blood vessels (angiogenesis) and nicotine is a vasoconstrictor (essentially, shrinks blood vessels).  Therefore, if vasoconstriction occurs to these microscopic blood vessels they essentially disappear and are unable to deliver necessary nutrients to the healing tissue.  This is one modifiable factor that you can do to dramatically increase your chances of healing your injury.    (This means no smoking, no nicotine gum, patches, etc)  DO NOT USE NONSTEROIDAL ANTI-INFLAMMATORY DRUGS (NSAID'S)  Using products such as Advil (ibuprofen), Aleve (naproxen), Motrin (ibuprofen) for additional pain control during fracture healing can delay and/or prevent the healing response.  If you would like to take over the counter (OTC) medication, Tylenol (acetaminophen) is ok.  However, some narcotic medications that are given for pain control contain acetaminophen as well. Therefore, you should not exceed more than 4000 mg of tylenol in a day if you do not have liver disease.  Also note that there are may OTC medicines, such as cold medicines and allergy medicines that my contain tylenol as well.  If you have any questions about medications and/or interactions please ask your doctor/PA or your pharmacist.      ICE AND ELEVATE INJURED/OPERATIVE EXTREMITY  Using ice and elevating the  injured extremity above your heart can help with swelling and pain control.  Icing in a pulsatile fashion, such as 20 minutes on and 20 minutes off, can be followed.    Do not place ice  directly on skin. Make sure there is a barrier between to skin and the ice pack.    Using frozen items such as frozen peas works well as the conform nicely to the are that needs to be iced.  USE AN ACE WRAP OR TED HOSE FOR SWELLING CONTROL  In addition to icing and elevation, Ace wraps or TED hose are used to help limit and resolve swelling.  It is recommended to use Ace wraps or TED hose until you are informed to stop.    When using Ace Wraps start the wrapping distally (farthest away from the body) and wrap proximally (closer to the body)   Example: If you had surgery on your leg or thing and you do not have a splint on, start the ace wrap at the toes and work your way up to the thigh        If you had surgery on your upper extremity and do not have a splint on, start the ace wrap at your fingers and work your way up to the upper arm  IF YOU ARE IN A SPLINT OR CAST DO NOT REMOVE IT FOR ANY REASON   If your splint gets wet for any reason please contact the office immediately. You may shower in your splint or cast as long as you keep it dry.  This can be done by wrapping in a cast cover or garbage back (or similar)  Do Not stick any thing down your splint or cast such as pencils, money, or hangers to try and scratch yourself with.  If you feel itchy take benadryl as prescribed on the bottle for itching  IF YOU ARE IN A CAM BOOT (BLACK BOOT)  You may remove boot periodically. Perform daily dressing changes as noted below.  Wash the liner of the boot regularly and wear a sock when wearing the boot. It is recommended that you sleep in the boot until told otherwise    Call office for the following:  Temperature greater than 101F  Persistent nausea and vomiting  Severe uncontrolled pain  Redness, tenderness, or signs of infection (pain, swelling, redness, odor or green/yellow discharge around the site)  Difficulty breathing, headache or visual disturbances  Hives  Persistent dizziness or  light-headedness  Extreme fatigue  Any other questions or concerns you may have after discharge  In an emergency, call 911 or go to an Emergency Department at a nearby hospital  HELPFUL INFORMATION  ? If you had a block, it will wear off between 8-24 hrs postop typically.  This is period when your pain may go from nearly zero to the pain you would have had postop without the block.  This is an abrupt transition but nothing dangerous is happening.  You may take an extra dose of narcotic when this happens.  ? You should wean off your narcotic medicines as soon as you are able.  Most patients will be off or using minimal narcotics before their first postop appointment.   ? We suggest you use the pain medication the first night prior to going to bed, in order to ease any pain when the anesthesia wears off. You should avoid taking pain medications on an empty stomach as it will make you nauseous.  ? Do not  drink alcoholic beverages or take illicit drugs when taking pain medications.  ? In most states it is against the law to drive while you are in a splint or sling.  And certainly against the law to drive while taking narcotics.  ? You may return to work/school in the next couple of days when you feel up to it.   ? Pain medication may make you constipated.  Below are a few solutions to try in this order: - Decrease the amount of pain medication if you aren't having pain. - Drink lots of decaffeinated fluids. - Drink prune juice and/or each dried prunes  o If the first 3 don't work start with additional solutions - Take Colace - an over-the-counter stool softener - Take Senokot - an over-the-counter laxative - Take Miralax - a stronger over-the-counter laxative     CALL THE OFFICE WITH ANY QUESTIONS OR CONCERNS: [phone]   VISIT OUR WEBSITE FOR ADDITIONAL INFORMATION: [email or web address]

## 2019-11-29 NOTE — Anesthesia Procedure Notes (Signed)
Anesthesia Regional Block: Adductor canal block   Pre-Anesthetic Checklist: ,, timeout performed, Correct Patient, Correct Site, Correct Laterality, Correct Procedure, Correct Position, site marked, Risks and benefits discussed,  Surgical consent,  Pre-op evaluation,  At surgeon's request and post-op pain management  Laterality: Right  Prep: chloraprep       Needles:  Injection technique: Single-shot  Needle Type: Echogenic Stimulator Needle     Needle Length: 9cm  Needle Gauge: 21     Additional Needles:   Procedures:,,,, ultrasound used (permanent image in chart),,,,  Narrative:  Start time: 11/29/2019 1:03 PM End time: 11/29/2019 1:08 PM Injection made incrementally with aspirations every 5 mL.  Performed by: Personally  Anesthesiologist: Shelton Silvas, MD  Additional Notes: Patient tolerated the procedure well. Local anesthetic introduced in an incremental fashion under minimal resistance after negative aspirations. No paresthesias were elicited. After completion of the procedure, no acute issues were identified and patient continued to be monitored by RN.

## 2019-11-29 NOTE — Anesthesia Preprocedure Evaluation (Signed)
Anesthesia Evaluation  Patient identified by MRN, date of birth, ID band Patient awake    Reviewed: Allergy & Precautions, NPO status , Patient's Chart, lab work & pertinent test results  Airway Mallampati: I  TM Distance: >3 FB Neck ROM: Full    Dental  (+) Teeth Intact, Dental Advisory Given   Pulmonary Current Smoker,    breath sounds clear to auscultation       Cardiovascular negative cardio ROS   Rhythm:Regular Rate:Normal     Neuro/Psych negative neurological ROS  negative psych ROS   GI/Hepatic negative GI ROS, Neg liver ROS,   Endo/Other  negative endocrine ROS  Renal/GU negative Renal ROS     Musculoskeletal negative musculoskeletal ROS (+)   Abdominal Normal abdominal exam  (+)   Peds  Hematology negative hematology ROS (+)   Anesthesia Other Findings   Reproductive/Obstetrics                             Anesthesia Physical Anesthesia Plan  ASA: II  Anesthesia Plan: General   Post-op Pain Management:  Regional for Post-op pain   Induction: Intravenous  PONV Risk Score and Plan: 2 and Ondansetron, Dexamethasone and Midazolam  Airway Management Planned: Oral ETT  Additional Equipment: None  Intra-op Plan:   Post-operative Plan: Extubation in OR  Informed Consent: I have reviewed the patients History and Physical, chart, labs and discussed the procedure including the risks, benefits and alternatives for the proposed anesthesia with the patient or authorized representative who has indicated his/her understanding and acceptance.       Plan Discussed with: CRNA  Anesthesia Plan Comments:        Anesthesia Quick Evaluation

## 2019-11-29 NOTE — Anesthesia Procedure Notes (Signed)
Procedure Name: Intubation Date/Time: 11/29/2019 1:38 PM Performed by: Marena Chancy, CRNA Pre-anesthesia Checklist: Patient identified, Emergency Drugs available, Suction available and Patient being monitored Patient Re-evaluated:Patient Re-evaluated prior to induction Oxygen Delivery Method: Circle System Utilized Preoxygenation: Pre-oxygenation with 100% oxygen Induction Type: IV induction Ventilation: Mask ventilation without difficulty Laryngoscope Size: Miller and 2 Grade View: Grade II Tube type: Oral Tube size: 7.5 mm Number of attempts: 1 Airway Equipment and Method: Stylet and Oral airway Placement Confirmation: ETT inserted through vocal cords under direct vision,  positive ETCO2 and breath sounds checked- equal and bilateral Tube secured with: Tape Dental Injury: Teeth and Oropharynx as per pre-operative assessment

## 2019-11-29 NOTE — Anesthesia Postprocedure Evaluation (Signed)
Anesthesia Post Note  Patient: Jerry Fischer  Procedure(s) Performed: OPEN REDUCTION INTERNAL FIXATION (ORIF) ANKLE FRACTURE (Right Ankle) EXTERNAL FIXATION LEG, removal (Right )     Patient location during evaluation: PACU Anesthesia Type: General Level of consciousness: awake Pain management: pain level controlled Vital Signs Assessment: post-procedure vital signs reviewed and stable Respiratory status: spontaneous breathing Cardiovascular status: stable Postop Assessment: no apparent nausea or vomiting Anesthetic complications: no   No complications documented.  Last Vitals:  Vitals:   11/29/19 1630 11/29/19 1645  BP: 114/67 127/77  Pulse: 84 76  Resp: 19 15  Temp:  36.6 C  SpO2: 98% 97%    Last Pain:  Vitals:   11/29/19 1645  TempSrc:   PainSc: 4         RLE Motor Response: Purposeful movement;Responds to commands (11/29/19 1645) RLE Sensation: Decreased;Numbness;Tingling (nerve block ) (11/29/19 1645)      Gregoria Selvy

## 2019-11-29 NOTE — Transfer of Care (Signed)
Immediate Anesthesia Transfer of Care Note  Patient: Jerry Fischer  Procedure(s) Performed: OPEN REDUCTION INTERNAL FIXATION (ORIF) ANKLE FRACTURE (Right Ankle) EXTERNAL FIXATION LEG, removal (Right )  Patient Location: PACU  Anesthesia Type:GA combined with regional for post-op pain  Level of Consciousness: awake, alert  and oriented  Airway & Oxygen Therapy: Patient Spontanous Breathing and Patient connected to nasal cannula oxygen  Post-op Assessment: Report given to RN and Post -op Vital signs reviewed and stable  Post vital signs: Reviewed and stable  Last Vitals:  Vitals Value Taken Time  BP 117/66 11/29/19 1615  Temp 36.5 C 11/29/19 1615  Pulse 78 11/29/19 1616  Resp 14 11/29/19 1616  SpO2 100 % 11/29/19 1616  Vitals shown include unvalidated device data.  Last Pain:  Vitals:   11/29/19 1317  TempSrc:   PainSc: 3          Complications: No complications documented.

## 2019-11-29 NOTE — Anesthesia Procedure Notes (Signed)
Anesthesia Regional Block: Popliteal block   Pre-Anesthetic Checklist: ,, timeout performed, Correct Patient, Correct Site, Correct Laterality, Correct Procedure, Correct Position, site marked, Risks and benefits discussed,  Surgical consent,  Pre-op evaluation,  At surgeon's request and post-op pain management  Laterality: Right  Prep: chloraprep       Needles:  Injection technique: Single-shot  Needle Type: Echogenic Stimulator Needle     Needle Length: 9cm  Needle Gauge: 21     Additional Needles:   Procedures:,,,, ultrasound used (permanent image in chart),,,,  Narrative:  Start time: 11/29/2019 12:55 PM End time: 11/29/2019 1:03 PM Injection made incrementally with aspirations every 5 mL.  Performed by: Personally  Anesthesiologist: Shelton Silvas, MD  Additional Notes: Patient tolerated the procedure well. Local anesthetic introduced in an incremental fashion under minimal resistance after negative aspirations. No paresthesias were elicited. After completion of the procedure, no acute issues were identified and patient continued to be monitored by RN.

## 2019-11-29 NOTE — Anesthesia Postprocedure Evaluation (Signed)
Anesthesia Post Note  Patient: Jerry Fischer  Procedure(s) Performed: OPEN REDUCTION INTERNAL FIXATION (ORIF) ANKLE FRACTURE (Right Ankle) EXTERNAL FIXATION LEG, removal (Right )     Patient location during evaluation: PACU Anesthesia Type: General Level of consciousness: awake Pain management: pain level controlled Vital Signs Assessment: post-procedure vital signs reviewed and stable Respiratory status: spontaneous breathing Cardiovascular status: stable Postop Assessment: no apparent nausea or vomiting Anesthetic complications: no   No complications documented.  Last Vitals:  Vitals:   11/29/19 1645 11/29/19 1700  BP: 127/77 123/79  Pulse: 76 73  Resp: 15 13  Temp: 36.6 C   SpO2: 97% 97%    Last Pain:  Vitals:   11/29/19 1645  TempSrc:   PainSc: 4                  Julie Nay

## 2019-11-30 ENCOUNTER — Encounter (HOSPITAL_COMMUNITY): Payer: Self-pay | Admitting: Orthopedic Surgery

## 2019-12-09 NOTE — Op Note (Signed)
PRE-OPERATIVE DIAGNOSIS:   1. RETAINED EXTERNALFIXATOR RIGHT ANKLE 2. ULCERATED PIN SITES LEG AND HEEL 3. TRIMALLEOLAR ANKLE FRACTURE RIGHT  POST-OPERATIVE DIAGNOSIS:   1. RETAINED EXTERNALFIXATOR RIGHT ANKLE 2. ULCERATED PIN SITES LEG AND HEEL 3. TRIMALLEOLAR ANKLE FRACTURE RIGHT  PROCEDURE:  Procedure(s): 1. OPEN REDUCTION INTERNAL FIXATION OF RIGHT TRIMALLEOLAR ANKLE FRACTURE WITHOUT FIXATION OF POSTERIOR LIP 2. OPEN REDUCTION INTERNAL FIXATION OF RIGHT SYNDESMOSIS 3. REMOVAL EXTERNAL FIXATION  RIGHT LEG (Left) 4. DEBRIDEMENT OF ULCERATED PIN SITES  SURGEON:  Surgeon(s) and Role:    * Myrene Galas, MD - Primary  PHYSICIAN ASSISTANT: Montez Morita, PA-C  ANESTHESIA:   general  EBL:  20 mL   BLOOD ADMINISTERED:none  DRAINS: none   LOCAL MEDICATIONS USED:  NONE  SPECIMEN:  No Specimen  DISPOSITION OF SPECIMEN:  N/A  COUNTS:  YES  TOURNIQUET: None  DICTATION: Note written in EPIC  PLAN OF CARE: Discharge to home after PACU  PATIENT DISPOSITION:  PACU - hemodynamically stable.   Delay start of Pharmacological VTE agent (>24hrs) due to surgical blood loss or risk of bleeding: no  INDICATIONS: Patient is s/p external fixation of left trimalleolar fracture dislocation. Now presents for repair and ex fix removal. I discussed with the patient the risks and benefits of surgery, including the possibility of infection, nerve injury, vessel injury, wound breakdown, arthritis, symptomatic hardware, DVT/ PE, loss of motion, malunion, nonunion, and need for further surgery among others. He acknowledged these risks and wished to proceed.   BRIEF SUMMARY OF PROCEDURE: After administration of preoperative antibiotics the patient was taken to the operating room and general anesthesia induced. A time out was held. The fixator clamps were then loosened and the pins removed, followed by a thorough scrubbing with chlorhexidine and wash. The pin sites, which had ulcerated at the leg and heel  around the pins and to a lesser extent at the metatarsals, were then debrided with curettes at the skin, subcutaneous tissues, muscle fascia layers, as well as the near and far bone cortices, removing all discernible devitalized necrotic tissue, bone debris, and desiccated fibrinous material. The calcaneal pin tract was debrided with curettage all the way through the bone canal to the opposite side of the calcaneus. This canal and all pin sites were thoroughly irrigated with saline.   An incision was made directly over the lateral malleolus with careful dissection to avoid injury to the superficial peroneal nerve. Given its proximal location and extensive comminution, the fracture site callous and periosteum were left intact as we continued deep dissection. With the assistance of distal manipulation and placement of tenaculums, we were able to obtain additional length. We used the Paragon system and placed standard fixation in the shaft and distal lateral malleolus, confirming plate position with x-ray and then continuing with a standard fixation as well as a locked fixation.  Final images showed appropriate reductional replacement, trajectory, and length.  Next, attention was turned to the medial side.  Here, a curvilinear incision was made to allow for distal placement of screws as well as direct access to the medial malleolus fracture site and joint.  There was a large medial malleolar fragment that did extend down to the anterior plafond.  This was debrided with curettage of the fracture edges as well as copious lavage of the joint.  I did inspect the posterior tibialis tendon, which was clearly visible and intact.  I did not identify any large fragments of articular cartilage loss off the dome of the talus.  There was a small scratch on the medial gutter.  I then placed a drill hole in the medial metaphysis of the tibia and used a pointed tenaculum to gain compression and reduction of the fracture site,  which was also seen to interdigitate as a 15 blade was used to scratch back the periosteum just at the edge for 2 mm or less.  This was followed by placement of K wires and then use of the cannulated drill placing 2 partially threaded screws.  Excellent compression was obtained.  The C-arm was then brought back in and AP, lateral and mortise views showed restoration of ankle alignment reduction.    The posterior malleolar fracture fragment was then evaluated to gauge whether it should be fixed if it constituted a significant portion of the articular surface or whether, as an attachment site of the syndesmotic ligaments, its disruption and displacement contributed to instability. Based on these factors it did not require fixation. I then addressed the syndesmotic widening with tenaculum compression in an anatomic trajectory checking all three views. We then placed two bicortical screws and one tricortical one near the subchondral bone level distally. Final images should excellent reduction and screw length as well as trajectory. Wounds were irrigated once more and then closed in standard layered fashion using 2-0 Vicryl and 3-0 nylon.  A sterile gently compressive dressing was applied, and then a posterior and stirrup splint with the ankle extended just above neutral.  PROGNOSIS: The patient will be nonweightbearing in the splint with ice and elevation over the next 3 to 5 days.  We will plan to see her back in the office in 10-14 days for removal of sutures and transition to a Cam boot with unrestricted range of motion of the ankle at that time.  Weightbearing at 8 weeks. His history of substance abuse significantly increases the risk of complications and could have catastrophic consequences, which has been discussed with patient and his mother, including loss of reduction, infection, disabling arthritis.

## 2019-12-26 NOTE — Interval H&P Note (Signed)
History and Physical Interval Note:  12/26/2019 3:04 PM  Jerry Fischer  has presented today for surgery, with the diagnosis of RIGHT ANKLE FRACTURE.  The various methods of treatment have been discussed with the patient and family. After consideration of risks, benefits and other options for treatment, the patient has consented to  Procedure(s): OPEN REDUCTION INTERNAL FIXATION (ORIF) ANKLE FRACTURE (Right) EXTERNAL FIXATION LEG, removal (Right) as a surgical intervention.  The patient's history has been reviewed, patient examined, no change in status, stable for surgery.  I have reviewed the patient's chart and labs.  Questions were answered to the patient's satisfaction.     Budd Palmer

## 2020-01-07 ENCOUNTER — Other Ambulatory Visit: Payer: Self-pay

## 2020-01-07 ENCOUNTER — Other Ambulatory Visit: Payer: Self-pay | Admitting: Family Medicine

## 2020-01-07 ENCOUNTER — Encounter (HOSPITAL_COMMUNITY): Payer: Self-pay

## 2020-01-07 ENCOUNTER — Ambulatory Visit (HOSPITAL_COMMUNITY)
Admission: EM | Admit: 2020-01-07 | Discharge: 2020-01-07 | Disposition: A | Discharge status: Home / Self Care | Payer: HRSA Program | Attending: Family Medicine | Admitting: Family Medicine

## 2020-01-07 DIAGNOSIS — J069 Acute upper respiratory infection, unspecified: Secondary | ICD-10-CM | POA: Diagnosis not present

## 2020-01-07 DIAGNOSIS — Z20822 Contact with and (suspected) exposure to covid-19: Secondary | ICD-10-CM | POA: Insufficient documentation

## 2020-01-07 LAB — SARS CORONAVIRUS 2 (TAT 6-24 HRS): SARS Coronavirus 2: NEGATIVE

## 2020-01-07 MED ORDER — PREDNISONE 50 MG PO TABS: ORAL_TABLET | ORAL | 0 refills | Status: DC

## 2020-01-07 MED ORDER — PROMETHAZINE-DM 6.25-15 MG/5ML PO SYRP: 5.0000 mL | ORAL_SOLUTION | Freq: Every evening | ORAL | 0 refills | Status: DC | PRN

## 2020-01-07 MED ORDER — ALBUTEROL SULFATE HFA 108 (90 BASE) MCG/ACT IN AERS: 1.0000 | INHALATION_SPRAY | Freq: Four times a day (QID) | RESPIRATORY_TRACT | 0 refills | Status: DC | PRN

## 2020-01-07 NOTE — ED Triage Notes (Signed)
Pt c/o productive cough with green sputum, nasal congestion, sore throat, HA since Thursday. States Sudafed has helped congestion.  Denies fever, n/v/d, body aches.   Last took tylenol and sudafed at 0700 this morning.

## 2020-01-07 NOTE — ED Provider Notes (Signed)
[facility]    CSN: [id number] Arrival date & time: 01/07/20  1349      History   Chief Complaint Chief Complaint  Patient presents with  . Cough  . Sore Throat    HPI Jerry Fischer is a 34 y.o. male.   Here today with several days of productive cough, sore throat, chest tightness with the cough overnight. Denies fever, chills, SOB, N/V/D, rashes. Has been taking dayquil and nyquil, tylenol, mucinex, sudafed without relief. No known sick contacts recently. No hx of allergies or asthma per patient.      Past Medical History:  Diagnosis Date  . Fracture    tibia and fibula, right  . Pneumonia    x 1 as a child    There are no problems to display for this patient.   Past Surgical History:  Procedure Laterality Date  . EXTERNAL FIXATION LEG Right 11/08/2019   Procedure: EXTERNAL FIXATION LEG;  Surgeon: Myrene Galas, MD;  Location: [facility] OR;  Service: Orthopedics;  Laterality: Right;  . EXTERNAL FIXATION LEG Right 11/29/2019   Procedure: EXTERNAL FIXATION LEG, removal;  Surgeon: Myrene Galas, MD;  Location: [facility] OR;  Service: Orthopedics;  Laterality: Right;  . ORIF ANKLE FRACTURE Right 11/29/2019   Procedure: OPEN REDUCTION INTERNAL FIXATION (ORIF) ANKLE FRACTURE;  Surgeon: Myrene Galas, MD;  Location: [facility] OR;  Service: Orthopedics;  Laterality: Right;  . TONSILLECTOMY    . WISDOM TOOTH EXTRACTION         Home Medications    Prior to Admission medications   Medication Sig Start Date End Date Taking? Authorizing Provider  gabapentin (NEURONTIN) 300 MG capsule Take 300 mg by mouth 3 (three) times daily. 11/14/19  Yes [provider]  albuterol (VENTOLIN HFA) 108 (90 Base) MCG/ACT inhaler Inhale 1-2 puffs into the lungs every 6 (six) hours as needed for wheezing or shortness of breath. 01/07/20   Particia Nearing, PA-C  apixaban (ELIQUIS) 2.5 MG TABS tablet Take 1 tablet (2.5 mg total) by mouth 2 (two) times daily. 11/08/19 12/08/19  Montez Morita,  PA-C  Cholecalciferol (VITAMIN D) 125 MCG (5000 UT) CAPS Take 1 capsule by mouth daily. Patient taking differently: Take 5,000 Units by mouth daily.  11/08/19 02/06/20  Montez Morita, PA-C  HYDROmorphone (DILAUDID) 2 MG tablet Take 1-2 mg by mouth every 6 (six) hours as needed for moderate pain or severe pain.  11/26/19   [provider]  methocarbamol (ROBAXIN) 500 MG tablet Take 500-1,000 mg by mouth every 8 (eight) hours. 11/21/19   [provider]  ondansetron (ZOFRAN ODT) 4 MG disintegrating tablet Take 1 tablet (4 mg total) by mouth every 8 (eight) hours as needed for nausea or vomiting. 11/08/19   Montez Morita, PA-C  predniSONE (DELTASONE) 50 MG tablet Take 1 tab with breakfast in the morning for 3 days 01/07/20   Particia Nearing, PA-C  promethazine-dextromethorphan (PROMETHAZINE-DM) 6.25-15 MG/5ML syrup Take 5 mLs by mouth at bedtime as needed for cough. 01/07/20   Particia Nearing, PA-C  fluticasone [facility]) 50 MCG/ACT nasal spray Place 2 sprays into both nostrils daily for 7 days. Patient not taking: Reported on 09/16/2018 05/23/18 02/21/19  Eustace Moore, MD    Family History Family History  Problem Relation Age of Onset  . Healthy Mother   . Healthy Father     Social History Social History   Tobacco Use  . Smoking status: Current Every Day Smoker    Packs/day: 0.25  Years: 10.00    Pack years: 2.50    Types: Cigarettes  . Smokeless tobacco: Never Used  Vaping Use  . Vaping Use: Never used  Substance Use Topics  . Alcohol use: No  . Drug use: No     Allergies   Patient has no known allergies.   Review of Systems Review of Systems PER HPI    Physical Exam Triage Vital Signs ED Triage Vitals  Enc Vitals Group     BP 01/07/20 1610 (!) 144/82     Pulse Rate 01/07/20 1610 79     Resp 01/07/20 1610 18     Temp 01/07/20 1610 99.2 F (37.3 C)     Temp Source 01/07/20 1610 Oral     SpO2 01/07/20 1610 99 %     Weight --      Height  --      Head Circumference --      Peak Flow --      Pain Score 01/07/20 1608 0     Pain Loc --      Pain Edu? --      Excl. in GC? --    No data found.  Updated Vital Signs BP (!) 144/82 (BP Location: Right Arm)   Pulse 79   Temp 99.2 F (37.3 C) (Oral)   Resp 18   SpO2 99%   Visual Acuity Right Eye Distance:   Left Eye Distance:   Bilateral Distance:    Right Eye Near:   Left Eye Near:    Bilateral Near:     Physical Exam Vitals and nursing note reviewed.  Constitutional:      Appearance: Normal appearance.  HENT:     Head: Atraumatic.     Right Ear: Tympanic membrane normal.     Left Ear: Tympanic membrane normal.     Nose: Nose normal.     Mouth/Throat:     Mouth: Mucous membranes are moist.     Pharynx: Oropharynx is clear. Posterior oropharyngeal erythema present.  Eyes:     Extraocular Movements: Extraocular movements intact.     Conjunctiva/sclera: Conjunctivae normal.  Cardiovascular:     Rate and Rhythm: Normal rate and regular rhythm.     Heart sounds: Normal heart sounds.  Pulmonary:     Effort: Pulmonary effort is normal. No respiratory distress.     Breath sounds: Normal breath sounds. No wheezing or rales.  Abdominal:     General: Bowel sounds are normal. There is no distension.     Palpations: Abdomen is soft.     Tenderness: There is no abdominal tenderness. There is no guarding.  Musculoskeletal:        General: Normal range of motion.     Cervical back: Normal range of motion and neck supple.  Skin:    General: Skin is warm and dry.  Neurological:     General: No focal deficit present.     Mental Status: He is oriented to person, place, and time.  Psychiatric:        Mood and Affect: Mood normal.        Thought Content: Thought content normal.        Judgment: Judgment normal.    UC Treatments / Results  Labs (all labs ordered are listed, but only abnormal results are displayed) Labs Reviewed  SARS CORONAVIRUS 2 (TAT 6-24 HRS)      EKG   Radiology No results found.  Procedures Procedures (including critical care time)  Medications Ordered in UC Medications - No data to display  Initial Impression / Assessment and Plan / UC Course  I have reviewed the triage vital signs and the nursing notes.  Pertinent labs & imaging results that were available during my care of the patient were reviewed by me and considered in my medical decision making (see chart for details).     Will r/o COVID, isolate until results return. Tx with continued mucinex, delsym, and start prednisone, phenergan DM syrup at bedtime, albuterol inhaler prn for chest tightness. Strict return precautions given.   Final Clinical Impressions(s) / UC Diagnoses   Final diagnoses:  Viral URI with cough   Discharge Instructions   None    ED Prescriptions    Medication Sig Dispense Auth. Provider   promethazine-dextromethorphan (PROMETHAZINE-DM) 6.25-15 MG/5ML syrup Take 5 mLs by mouth at bedtime as needed for cough. 50 mL Particia Nearing, PA-C   predniSONE (DELTASONE) 50 MG tablet Take 1 tab with breakfast in the morning for 3 days 3 tablet Particia Nearing, PA-C   albuterol (VENTOLIN HFA) 108 (90 Base) MCG/ACT inhaler Inhale 1-2 puffs into the lungs every 6 (six) hours as needed for wheezing or shortness of breath. 18 g Particia Nearing, [place]     PDMP not reviewed this encounter.   Particia Nearing, [place] 01/07/20 1759

## 2020-01-07 NOTE — ED Notes (Addendum)
After leaving room, pt requested different Rx for cough, stating that "I took some of my wife's prescription of that medication the other night and it didn't help the cough." Requested hydrocodone for cough. Per Roosvelt Maser, pt informed that provider will not give narcotic cough medicine and that the prednisone and albuterol should help.

## 2020-01-29 ENCOUNTER — Other Ambulatory Visit: Payer: Self-pay | Admitting: Family Medicine

## 2020-03-25 ENCOUNTER — Other Ambulatory Visit: Payer: Self-pay

## 2020-03-25 ENCOUNTER — Other Ambulatory Visit (HOSPITAL_COMMUNITY): Payer: Self-pay

## 2020-03-25 ENCOUNTER — Encounter (HOSPITAL_COMMUNITY): Payer: Self-pay | Admitting: Orthopedic Surgery

## 2020-03-25 NOTE — Progress Notes (Signed)
Mr. Poulson denies chest pain or shortness of breath.  Patient denies s/s of Covid and denies being in contact with anyone with s/s.  Mr Cossey will be tested for Covid 03/26/20.

## 2020-03-26 ENCOUNTER — Encounter (HOSPITAL_COMMUNITY): Payer: Self-pay | Admitting: Orthopedic Surgery

## 2020-03-26 ENCOUNTER — Other Ambulatory Visit (HOSPITAL_COMMUNITY)
Admission: RE | Admit: 2020-03-26 | Discharge: 2020-03-26 | Disposition: A | Discharge status: Home / Self Care | Payer: Self-pay | Source: Ambulatory Visit | Attending: Orthopedic Surgery | Admitting: Orthopedic Surgery

## 2020-03-26 DIAGNOSIS — Z01812 Encounter for preprocedural laboratory examination: Secondary | ICD-10-CM | POA: Insufficient documentation

## 2020-03-26 DIAGNOSIS — Z20822 Contact with and (suspected) exposure to covid-19: Secondary | ICD-10-CM | POA: Insufficient documentation

## 2020-03-26 DIAGNOSIS — F1911 Other psychoactive substance abuse, in remission: Secondary | ICD-10-CM

## 2020-03-26 HISTORY — DX: Other psychoactive substance abuse, in remission: F19.11

## 2020-03-26 LAB — SARS CORONAVIRUS 2 (TAT 6-24 HRS): SARS Coronavirus 2: NEGATIVE

## 2020-03-26 NOTE — H&P (Signed)
Orthopaedic Trauma Service (OTS) Consult   Patient ID: Jerry Fischer MRN: [id number] DOB/AGE: 1985-10-11 35 y.o.  HPI: Jerry ROTHLISBERGER is an 35 y.o. male s/p ORIF complex R ankle fracture dislocation including syndesmosis. Pt has done well. He has healed. He presents today for removal of symptomatic hardware.  Pt has failed conservative measures    Past Medical History:  Diagnosis Date  . Fracture    tibia and fibula, right  . History of substance abuse (HCC) 03/26/2020  . Pneumonia    x 1 as a child    Past Surgical History:  Procedure Laterality Date  . EXTERNAL FIXATION LEG Right 11/08/2019   Procedure: EXTERNAL FIXATION LEG;  Surgeon: Myrene Galas, MD;  Location: [facility] OR;  Service: Orthopedics;  Laterality: Right;  . EXTERNAL FIXATION LEG Right 11/29/2019   Procedure: EXTERNAL FIXATION LEG, removal;  Surgeon: Myrene Galas, MD;  Location: [facility] OR;  Service: Orthopedics;  Laterality: Right;  . ORIF ANKLE FRACTURE Right 11/29/2019   Procedure: OPEN REDUCTION INTERNAL FIXATION (ORIF) ANKLE FRACTURE;  Surgeon: Myrene Galas, MD;  Location: [facility] OR;  Service: Orthopedics;  Laterality: Right;  . TONSILLECTOMY    . WISDOM TOOTH EXTRACTION      Family History  Problem Relation Age of Onset  . Healthy Mother   . Healthy Father     Social History:  reports that he quit smoking about 3 months ago. His smoking use included cigarettes. He has a 2.50 pack-year smoking history. He has never used smokeless tobacco. He reports that he does not drink alcohol and does not use drugs.  Allergies: No Known Allergies  Medications: I have reviewed the patient's current medications.  Current Meds  Medication Sig  . albuterol (VENTOLIN HFA) 108 (90 Base) MCG/ACT inhaler Inhale 1-2 puffs into the lungs every 6 (six) hours as needed for wheezing or shortness of breath.     No results found for this or any previous visit (from the past 48 hour(s)).  No results found.  Intake/Output     None      Review of Systems  Constitutional: Negative for chills and fever.  Respiratory: Negative for shortness of breath and wheezing.   Cardiovascular: Negative for chest pain and palpitations.  Gastrointestinal: Negative for abdominal pain, nausea and vomiting.  Neurological: Negative for tingling and sensory change.   Height 6\' 2"  (1.88 m), weight 81.6 kg. Physical Exam Constitutional:      General: He is not in acute distress.    Appearance: Normal appearance.  HENT:     Head: Normocephalic and atraumatic.  Cardiovascular:     Rate and Rhythm: Normal rate and regular rhythm.  Pulmonary:     Effort: Pulmonary effort is normal.     Breath sounds: Normal breath sounds.  Abdominal:     General: Abdomen is flat.     Palpations: Abdomen is soft.  Musculoskeletal:     Comments: Right Lower Extremity  Wounds well healed Ext warm  Motor and sensory functions intact + DP pulse Minimal swelling TTP over lateral hardware  No DCT  Compartments soft and nontender    Skin:    General: Skin is warm and dry.     Capillary Refill: Capillary refill takes less than 2 seconds.  Neurological:     General: No focal deficit present.     Mental Status: He is alert and oriented to person, place, and time.  Psychiatric:  Mood and Affect: Mood normal.        Thought Content: Thought content normal.        Judgment: Judgment normal.     Assessment/Plan:  35 y/o male with retained hardware R ankle s/p ORIF R ankle fracture dislocation and syndesmotic disruption   -R ankle fracture dislocation and syndesmotic disruption s/p ORIF with symptomatic hardware  OR for [facility]   Outpatient procedure   Dc home same day   Risks and benefits reviewed with pt and he wishes to proceed    WBAT post op   Dressing changes/wound care starting on POD 2     - Dispo:  OR for [facility] R ankle      Mearl Latin, PA-C [phone] (C) 03/26/2020, 5:21 PM  [facility] [address] [place] [address] [phone] Val [phone] (F)    After 5pm and on the weekends please log on to Amion, go to orthopaedics and the look under the [facility] Call for the provider(s) on call. You can also call our office at [phone] and then follow the prompts to be connected to the call team.

## 2020-03-26 NOTE — Anesthesia Preprocedure Evaluation (Addendum)
Anesthesia Evaluation  Patient identified by MRN, date of birth, ID band Patient awake    Reviewed: Allergy & Precautions, NPO status , Patient's Chart, lab work & pertinent test results  Airway Mallampati: I  TM Distance: >3 FB Neck ROM: Full    Dental  (+) Teeth Intact, Dental Advisory Given   Pulmonary former smoker,    breath sounds clear to auscultation       Cardiovascular negative cardio ROS   Rhythm:Regular Rate:Normal     Neuro/Psych negative neurological ROS  negative psych ROS   GI/Hepatic negative GI ROS, (+)     substance abuse  ,   Endo/Other  negative endocrine ROS  Renal/GU negative Renal ROS  negative genitourinary   Musculoskeletal negative musculoskeletal ROS (+)   Abdominal Normal abdominal exam  (+)   Peds  Hematology negative hematology ROS (+)   Anesthesia Other Findings   Reproductive/Obstetrics negative OB ROS                            Anesthesia Physical  Anesthesia Plan  ASA: II  Anesthesia Plan: General   Post-op Pain Management:    Induction: Intravenous  PONV Risk Score and Plan: 2 and Ondansetron, Dexamethasone and Midazolam  Airway Management Planned: LMA  Additional Equipment: None  Intra-op Plan:   Post-operative Plan: Extubation in OR  Informed Consent: I have reviewed the patients History and Physical, chart, labs and discussed the procedure including the risks, benefits and alternatives for the proposed anesthesia with the patient or authorized representative who has indicated his/her understanding and acceptance.       Plan Discussed with: CRNA and Anesthesiologist  Anesthesia Plan Comments:        Anesthesia Quick Evaluation

## 2020-03-27 ENCOUNTER — Ambulatory Visit (HOSPITAL_COMMUNITY): Payer: Self-pay

## 2020-03-27 ENCOUNTER — Other Ambulatory Visit: Payer: Self-pay

## 2020-03-27 ENCOUNTER — Ambulatory Visit (HOSPITAL_COMMUNITY): Payer: Self-pay | Admitting: Certified Registered"

## 2020-03-27 ENCOUNTER — Encounter (HOSPITAL_COMMUNITY)
Admission: RE | Disposition: A | Discharge status: Home / Self Care | Payer: Self-pay | Source: Home / Self Care | Attending: Orthopedic Surgery

## 2020-03-27 ENCOUNTER — Ambulatory Visit (HOSPITAL_COMMUNITY)
Admission: RE | Admit: 2020-03-27 | Discharge: 2020-03-27 | Disposition: A | Discharge status: Home / Self Care | Payer: Self-pay | Attending: Orthopedic Surgery | Admitting: Orthopedic Surgery

## 2020-03-27 ENCOUNTER — Encounter (HOSPITAL_COMMUNITY): Payer: Self-pay | Admitting: Orthopedic Surgery

## 2020-03-27 DIAGNOSIS — T1490XA Injury, unspecified, initial encounter: Secondary | ICD-10-CM

## 2020-03-27 DIAGNOSIS — Z87891 Personal history of nicotine dependence: Secondary | ICD-10-CM | POA: Insufficient documentation

## 2020-03-27 DIAGNOSIS — T8489XA Other specified complication of internal orthopedic prosthetic devices, implants and grafts, initial encounter: Secondary | ICD-10-CM | POA: Insufficient documentation

## 2020-03-27 DIAGNOSIS — Y831 Surgical operation with implant of artificial internal device as the cause of abnormal reaction of the patient, or of later complication, without mention of misadventure at the time of the procedure: Secondary | ICD-10-CM | POA: Insufficient documentation

## 2020-03-27 HISTORY — PX: HARDWARE REMOVAL: SHX979

## 2020-03-27 LAB — SURGICAL PCR SCREEN
MRSA, PCR: NEGATIVE
Staphylococcus aureus: POSITIVE — AB

## 2020-03-27 SURGERY — REMOVAL, HARDWARE
Anesthesia: General | Site: Ankle | Laterality: Right

## 2020-03-27 MED ORDER — OXYCODONE HCL 5 MG PO TABS
5.0000 mg | ORAL_TABLET | Freq: Once | ORAL | Status: DC | PRN
Start: 2020-03-27 — End: 2020-03-27

## 2020-03-27 MED ORDER — METHYLPREDNISOLONE ACETATE 40 MG/ML IJ SUSP
INTRAMUSCULAR | Status: AC
  Filled 2020-03-27: qty 1

## 2020-03-27 MED ORDER — ORAL CARE MOUTH RINSE: 15.0000 mL | Freq: Once | OROMUCOSAL | Status: AC

## 2020-03-27 MED ORDER — PROMETHAZINE HCL 25 MG/ML IJ SOLN: 6.2500 mg | INTRAMUSCULAR | Status: DC | PRN

## 2020-03-27 MED ORDER — CHLORHEXIDINE GLUCONATE 0.12 % MT SOLN
15.0000 mL | Freq: Once | OROMUCOSAL | Status: AC
  Administered 2020-03-27: 15 mL via OROMUCOSAL
  Filled 2020-03-27: qty 15

## 2020-03-27 MED ORDER — METHYLPREDNISOLONE ACETATE 40 MG/ML IJ SUSP
INTRAMUSCULAR | Status: DC | PRN
  Administered 2020-03-27: 40 mg

## 2020-03-27 MED ORDER — SUFENTANIL CITRATE 50 MCG/ML IV SOLN
INTRAVENOUS | Status: DC | PRN
  Administered 2020-03-27: 10 ug via INTRAVENOUS

## 2020-03-27 MED ORDER — PROPOFOL 10 MG/ML IV BOLUS
INTRAVENOUS | Status: AC
  Filled 2020-03-27: qty 20

## 2020-03-27 MED ORDER — MIDAZOLAM HCL 2 MG/2ML IJ SOLN
INTRAMUSCULAR | Status: AC
  Filled 2020-03-27: qty 2

## 2020-03-27 MED ORDER — CHLORHEXIDINE GLUCONATE 4 % EX LIQD: 60.0000 mL | Freq: Once | CUTANEOUS | Status: DC

## 2020-03-27 MED ORDER — FENTANYL CITRATE (PF) 100 MCG/2ML IJ SOLN
INTRAMUSCULAR | Status: AC
  Filled 2020-03-27: qty 2

## 2020-03-27 MED ORDER — GLYCOPYRROLATE 0.2 MG/ML IJ SOLN
INTRAMUSCULAR | Status: DC | PRN
  Administered 2020-03-27: .2 mg via INTRAVENOUS

## 2020-03-27 MED ORDER — LIDOCAINE HCL (CARDIAC) PF 100 MG/5ML IV SOSY
PREFILLED_SYRINGE | INTRAVENOUS | Status: DC | PRN
  Administered 2020-03-27: 100 mg via INTRAVENOUS

## 2020-03-27 MED ORDER — ONDANSETRON HCL 4 MG/2ML IJ SOLN
INTRAMUSCULAR | Status: DC | PRN
  Administered 2020-03-27: 4 mg via INTRAVENOUS

## 2020-03-27 MED ORDER — LACTATED RINGERS IV SOLN: INTRAVENOUS | Status: DC | PRN

## 2020-03-27 MED ORDER — MIDAZOLAM HCL 2 MG/2ML IJ SOLN
INTRAMUSCULAR | Status: DC | PRN
  Administered 2020-03-27: 2 mg via INTRAVENOUS

## 2020-03-27 MED ORDER — PROPOFOL 10 MG/ML IV BOLUS
INTRAVENOUS | Status: DC | PRN
  Administered 2020-03-27: 200 mg via INTRAVENOUS

## 2020-03-27 MED ORDER — LACTATED RINGERS IV SOLN: INTRAVENOUS | Status: DC

## 2020-03-27 MED ORDER — POVIDONE-IODINE 10 % EX SWAB
2.0000 "application " | Freq: Once | CUTANEOUS | Status: AC
  Administered 2020-03-27: 2 via TOPICAL

## 2020-03-27 MED ORDER — OXYCODONE HCL 5 MG/5ML PO SOLN: 5.0000 mg | Freq: Once | ORAL | Status: DC | PRN

## 2020-03-27 MED ORDER — CEFAZOLIN SODIUM-DEXTROSE 2-4 GM/100ML-% IV SOLN
2.0000 g | INTRAVENOUS | Status: AC
  Administered 2020-03-27: 2 g via INTRAVENOUS
  Filled 2020-03-27: qty 100

## 2020-03-27 MED ORDER — ACETAMINOPHEN 500 MG PO TABS
1000.0000 mg | ORAL_TABLET | Freq: Once | ORAL | Status: AC
  Administered 2020-03-27: 1000 mg via ORAL
  Filled 2020-03-27: qty 2

## 2020-03-27 MED ORDER — KETOROLAC TROMETHAMINE 10 MG PO TABS: 10.0000 mg | ORAL_TABLET | Freq: Four times a day (QID) | ORAL | 0 refills | Status: DC | PRN

## 2020-03-27 MED ORDER — 0.9 % SODIUM CHLORIDE (POUR BTL) OPTIME
TOPICAL | Status: DC | PRN
  Administered 2020-03-27: 1000 mL

## 2020-03-27 MED ORDER — SUFENTANIL CITRATE 50 MCG/ML IV SOLN
INTRAVENOUS | Status: AC
  Filled 2020-03-27: qty 1

## 2020-03-27 MED ORDER — FENTANYL CITRATE (PF) 100 MCG/2ML IJ SOLN
25.0000 ug | INTRAMUSCULAR | Status: DC | PRN
  Administered 2020-03-27 (×2): 50 ug via INTRAVENOUS

## 2020-03-27 MED ORDER — TRAMADOL HCL 50 MG PO TABS: 50.0000 mg | ORAL_TABLET | Freq: Two times a day (BID) | ORAL | 0 refills | Status: DC | PRN

## 2020-03-27 MED ORDER — DEXAMETHASONE SODIUM PHOSPHATE 10 MG/ML IJ SOLN
INTRAMUSCULAR | Status: DC | PRN
  Administered 2020-03-27: 10 mg via INTRAVENOUS

## 2020-03-27 SURGICAL SUPPLY — 62 items
BANDAGE ESMARK 6X9 LF (GAUZE/BANDAGES/DRESSINGS) ×1 IMPLANT
BNDG CMPR 9X6 STRL LF SNTH (GAUZE/BANDAGES/DRESSINGS) ×1
BNDG COHESIVE 6X5 TAN STRL LF (GAUZE/BANDAGES/DRESSINGS) ×2 IMPLANT
BNDG ELASTIC 4X5.8 VLCR STR LF (GAUZE/BANDAGES/DRESSINGS) ×2 IMPLANT
BNDG ELASTIC 6X5.8 VLCR STR LF (GAUZE/BANDAGES/DRESSINGS) ×2 IMPLANT
BNDG ESMARK 6X9 LF (GAUZE/BANDAGES/DRESSINGS) ×2
BNDG GAUZE ELAST 4 BULKY (GAUZE/BANDAGES/DRESSINGS) ×4 IMPLANT
BRUSH SCRUB EZ PLAIN DRY (MISCELLANEOUS) ×4 IMPLANT
COVER SURGICAL LIGHT HANDLE (MISCELLANEOUS) ×4 IMPLANT
COVER WAND RF STERILE (DRAPES) ×2 IMPLANT
CUFF TOURN SGL QUICK 18X4 (TOURNIQUET CUFF) IMPLANT
CUFF TOURN SGL QUICK 24 (TOURNIQUET CUFF)
CUFF TOURN SGL QUICK 34 (TOURNIQUET CUFF)
CUFF TRNQT CYL 24X4X16.5-23 (TOURNIQUET CUFF) IMPLANT
CUFF TRNQT CYL 34X4.125X (TOURNIQUET CUFF) IMPLANT
DRAPE C-ARM 42X72 X-RAY (DRAPES) IMPLANT
DRAPE C-ARMOR (DRAPES) ×2 IMPLANT
DRAPE U-SHAPE 47X51 STRL (DRAPES) ×2 IMPLANT
DRSG ADAPTIC 3X8 NADH LF (GAUZE/BANDAGES/DRESSINGS) ×2 IMPLANT
DRSG MEPILEX BORDER 4X4 (GAUZE/BANDAGES/DRESSINGS) ×1 IMPLANT
ELECT REM PT RETURN 9FT ADLT (ELECTROSURGICAL) ×2
ELECTRODE REM PT RTRN 9FT ADLT (ELECTROSURGICAL) ×1 IMPLANT
GAUZE SPONGE 4X4 12PLY STRL (GAUZE/BANDAGES/DRESSINGS) ×2 IMPLANT
GLOVE BIO SURGEON STRL SZ7.5 (GLOVE) ×2 IMPLANT
GLOVE BIO SURGEON STRL SZ8 (GLOVE) ×2 IMPLANT
GLOVE BIOGEL PI IND STRL 7.5 (GLOVE) ×1 IMPLANT
GLOVE BIOGEL PI IND STRL 8 (GLOVE) ×1 IMPLANT
GLOVE BIOGEL PI INDICATOR 7.5 (GLOVE) ×1
GLOVE BIOGEL PI INDICATOR 8 (GLOVE) ×1
GOWN STRL REUS W/ TWL LRG LVL3 (GOWN DISPOSABLE) ×2 IMPLANT
GOWN STRL REUS W/ TWL XL LVL3 (GOWN DISPOSABLE) ×1 IMPLANT
GOWN STRL REUS W/TWL LRG LVL3 (GOWN DISPOSABLE) ×4
GOWN STRL REUS W/TWL XL LVL3 (GOWN DISPOSABLE) ×2
KIT BASIN OR (CUSTOM PROCEDURE TRAY) ×2 IMPLANT
KIT TURNOVER KIT B (KITS) ×2 IMPLANT
MANIFOLD NEPTUNE II (INSTRUMENTS) ×2 IMPLANT
NEEDLE 22X1 1/2 (OR ONLY) (NEEDLE) IMPLANT
NS IRRIG 1000ML POUR BTL (IV SOLUTION) ×2 IMPLANT
PACK ORTHO EXTREMITY (CUSTOM PROCEDURE TRAY) ×2 IMPLANT
PAD ARMBOARD 7.5X6 YLW CONV (MISCELLANEOUS) ×4 IMPLANT
PAD CAST 4YDX4 CTTN HI CHSV (CAST SUPPLIES) IMPLANT
PADDING CAST COTTON 4X4 STRL (CAST SUPPLIES) ×2
PADDING CAST COTTON 6X4 STRL (CAST SUPPLIES) ×6 IMPLANT
SPONGE LAP 18X18 RF (DISPOSABLE) ×2 IMPLANT
STAPLER VISISTAT 35W (STAPLE) IMPLANT
STOCKINETTE IMPERVIOUS LG (DRAPES) ×2 IMPLANT
STRIP CLOSURE SKIN 1/2X4 (GAUZE/BANDAGES/DRESSINGS) IMPLANT
SUCTION FRAZIER HANDLE 10FR (MISCELLANEOUS)
SUCTION TUBE FRAZIER 10FR DISP (MISCELLANEOUS) IMPLANT
SUT ETHILON 3 0 PS 1 (SUTURE) IMPLANT
SUT PDS AB 2-0 CT1 27 (SUTURE) IMPLANT
SUT VIC AB 0 CT1 27 (SUTURE)
SUT VIC AB 0 CT1 27XBRD ANBCTR (SUTURE) IMPLANT
SUT VIC AB 2-0 CT1 27 (SUTURE)
SUT VIC AB 2-0 CT1 TAPERPNT 27 (SUTURE) IMPLANT
SYR CONTROL 10ML LL (SYRINGE) IMPLANT
TOWEL GREEN STERILE (TOWEL DISPOSABLE) ×4 IMPLANT
TOWEL GREEN STERILE FF (TOWEL DISPOSABLE) ×4 IMPLANT
TUBE CONNECTING 12X1/4 (SUCTIONS) ×2 IMPLANT
UNDERPAD 30X36 HEAVY ABSORB (UNDERPADS AND DIAPERS) ×2 IMPLANT
WATER STERILE IRR 1000ML POUR (IV SOLUTION) ×4 IMPLANT
YANKAUER SUCT BULB TIP NO VENT (SUCTIONS) ×2 IMPLANT

## 2020-03-27 NOTE — Anesthesia Procedure Notes (Signed)
Procedure Name: LMA Insertion Date/Time: 03/27/2020 8:26 AM Performed by: Melina Schools, CRNA Pre-anesthesia Checklist: Patient identified, Emergency Drugs available, Suction available, Patient being monitored and Timeout performed Patient Re-evaluated:Patient Re-evaluated prior to induction Oxygen Delivery Method: Circle system utilized Preoxygenation: Pre-oxygenation with 100% oxygen Induction Type: IV induction Ventilation: Mask ventilation without difficulty LMA: LMA inserted LMA Size: 4.0 Number of attempts: 1 Placement Confirmation: positive ETCO2 and breath sounds checked- equal and bilateral Tube secured with: Tape Dental Injury: Teeth and Oropharynx as per pre-operative assessment

## 2020-03-27 NOTE — Transfer of Care (Signed)
Immediate Anesthesia Transfer of Care Note  Patient: Jerry Fischer  Procedure(s) Performed: HARDWARE REMOVAL (Right Ankle)  Patient Location: PACU  Anesthesia Type:General  Level of Consciousness: drowsy, patient cooperative and responds to stimulation  Airway & Oxygen Therapy: Patient Spontanous Breathing and Patient connected to nasal cannula oxygen  Post-op Assessment: Report given to RN, Post -op Vital signs reviewed and stable and Patient moving all extremities X 4  Post vital signs: Reviewed and stable  Last Vitals:  Vitals Value Taken Time  BP 139/80 03/27/20 0930  Temp    Pulse 81 03/27/20 0930  Resp 11 03/27/20 0930  SpO2 98 % 03/27/20 0930  Vitals shown include unvalidated device data.  Last Pain:  Vitals:   03/27/20 0710  PainSc: 4       Patients Stated Pain Goal: 2 (03/27/20 0710)  Complications: No complications documented.

## 2020-03-27 NOTE — Op Note (Signed)
03/27/2020  9:51 AM  PATIENT:  Derrek Monaco  35 y.o. male  PRE-OPERATIVE DIAGNOSIS:  SYMPTOMATIC HARDWARE RIGHT ANKLE   POST-OPERATIVE DIAGNOSIS:  SYMPTOMATIC HARDWARE RIGHT ANKLE  PROCEDURE:  Procedure(s): 1. HARDWARE REMOVAL (Right) ANKLE SYNDESMOTIC SCREWS 2. MANUAL APPLICATION OF STRESS UNDER FLUORO OF RIGHT ANKLE SYNDESMOSIS 3. STEROID INJECTION OF RIGHT ANKLE  SURGEON:  Surgeon(s) and Role:    * Myrene Galas, MD - Primary  PHYSICIAN ASSISTANT: PA Student  ANESTHESIA:   general  EBL:  10 mL   BLOOD ADMINISTERED:none  DRAINS: none   LOCAL MEDICATIONS USED:  NONE  SPECIMEN:  No Specimen  DISPOSITION OF SPECIMEN:  N/A  COUNTS:  YES  TOURNIQUET:  * No tourniquets in log *  DICTATION: Note written in EPIC  PLAN OF CARE: Discharge to home after PACU  PATIENT DISPOSITION:  PACU - hemodynamically stable.   Delay start of Pharmacological VTE agent (>24hrs) due to surgical blood loss or risk of bleeding: no  BRIEF SUMMARY OF INDICATION FOR PROCEDURE:  Patient is a pleasant 35 y.o. who underwent plate fixation of an ankle fracture including repair of the syndesmosis. He is now several months out from repair with expected healing but also the development of some symptoms around the screws, x-ray changes consistent with loosening, and increasing potential for breakage. Therefore, I discussed with the patient the risks and benefits of surgical removal including infection, nerve or vessel injury, failure to alleviate symptoms, occult instability and loss of reduction, fracture, DVT, PE, and multiple others. The patient provided consent to proceed.  BRIEF SUMMARY OF PROCEDURE:  The patient was taken to the operating room after administration of 2 g of Ancef.  General anesthesia was induced. The right lower extremity was prepped and draped in usual sterile fashion.  No tourniquet was used during the procedure.  C-arm was brought in to confirm position of the hardware.  I  remade the old distal incision and dissected sharply down to the plate, elevating the soft tissues. I identified and removed all screws without complication. Then under live fluoro, an external rotation stress test was applied manually, which demonstrated a stable syndesmosis with no talar translation, increase in the syndesmotic interval, or medial clear space widening. Lastly, I injected the ankle joint with 40 mg of depomedrol. The lateral wound was irrigated thoroughly and closed with 2-0 vicryl and 2-0 nylon. A sterile gently compressive dressing was applied.  The patient was taken to the PACU in stable condition.  A PA student assisted me throughout.  PROGNOSIS: Patient will be weightbearing as tolerated with aggressive active and passive motion of the knee and ankle. Bleeding would be anticipated. The dressing may be removed in 48 hours after which time it would be fine to shower with gentle soap and water. Ice and elevate. No ointments. Patient will follow up in 10 days for removal of sutures.    Doralee Albino. Carola Frost, M.D.

## 2020-03-27 NOTE — Anesthesia Postprocedure Evaluation (Signed)
Anesthesia Post Note  Patient: Jerry Fischer  Procedure(s) Performed: HARDWARE REMOVAL (Right Ankle)     Patient location during evaluation: PACU Anesthesia Type: General Level of consciousness: awake and alert, oriented and patient cooperative Pain management: pain level controlled Vital Signs Assessment: post-procedure vital signs reviewed and stable Respiratory status: spontaneous breathing, nonlabored ventilation and respiratory function stable Cardiovascular status: blood pressure returned to baseline and stable Postop Assessment: no apparent nausea or vomiting Anesthetic complications: no   No complications documented.  Last Vitals:  Vitals:   03/27/20 0930 03/27/20 0945  BP: 139/80 122/71  Pulse: 65 61  Resp: 11 12  Temp: 36.7 C   SpO2: 96% 99%    Last Pain:  Vitals:   03/27/20 0945  PainSc: 6                  Lannie Fields

## 2020-03-27 NOTE — Discharge Instructions (Addendum)
[facility] Discharge Instructions   General Discharge Instructions   WEIGHT BEARING STATUS: Weight-bear as tolerated right leg  RANGE OF MOTION/ACTIVITY: Unrestricted range of motion right ankle.  Activity as tolerated.  Slowly increase activity  Wound Care: Daily wound care starting on 03/29/2020.  Please see below  Discharge Wound Care Instructions  Do NOT apply any ointments, solutions or lotions to pin sites or surgical wounds.  These prevent needed drainage and even though solutions like hydrogen peroxide kill bacteria, they also damage cells lining the pin sites that help fight infection.  Applying lotions or ointments can keep the wounds moist and can cause them to breakdown and open up as well. This can increase the risk for infection. When in doubt call the office.  Surgical incisions should be dressed daily.  If any drainage is noted, use one layer of adaptic, then gauze, Kerlix, and an ace wrap.  Once the incision is completely dry and without drainage, it may be left open to air out.  Showering may begin 36-48 hours later.  Cleaning gently with soap and water.    Diet: as you were eating previously.  Can use over the counter stool softeners and bowel preparations, such as Miralax, to help with bowel movements.  Narcotics can be constipating.  Be sure to drink plenty of fluids  PAIN MEDICATION USE AND EXPECTATIONS  You have likely been given narcotic medications to help control your pain.  After a traumatic event that results in an fracture (broken bone) with or without surgery, it is ok to use narcotic pain medications to help control one's pain.  We understand that everyone responds to pain differently and each individual patient will be evaluated on a regular basis for the continued need for narcotic medications. Ideally, narcotic medication use should last no more than 6-8 weeks (coinciding with fracture healing).   As a patient it is your responsibility  as well to monitor narcotic medication use and report the amount and frequency you use these medications when you come to your office visit.   We would also advise that if you are using narcotic medications, you should take a dose prior to therapy to maximize you participation.  IF YOU ARE ON NARCOTIC MEDICATIONS IT IS NOT PERMISSIBLE TO OPERATE A MOTOR VEHICLE (MOTORCYCLE/CAR/TRUCK/MOPED) OR HEAVY MACHINERY DO NOT MIX NARCOTICS WITH OTHER CNS (CENTRAL NERVOUS SYSTEM) DEPRESSANTS SUCH AS ALCOHOL   STOP SMOKING OR USING NICOTINE PRODUCTS!!!!  As discussed nicotine severely impairs your body's ability to heal surgical and traumatic wounds but also impairs bone healing.  Wounds and bone heal by forming microscopic blood vessels (angiogenesis) and nicotine is a vasoconstrictor (essentially, shrinks blood vessels).  Therefore, if vasoconstriction occurs to these microscopic blood vessels they essentially disappear and are unable to deliver necessary nutrients to the healing tissue.  This is one modifiable factor that you can do to dramatically increase your chances of healing your injury.    (This means no smoking, no nicotine gum, patches, etc)  DO NOT USE NONSTEROIDAL ANTI-INFLAMMATORY DRUGS (NSAID'S)  Using products such as Advil (ibuprofen), Aleve (naproxen), Motrin (ibuprofen) for additional pain control during fracture healing can delay and/or prevent the healing response.  If you would like to take over the counter (OTC) medication, Tylenol (acetaminophen) is ok.  However, some narcotic medications that are given for pain control contain acetaminophen as well. Therefore, you should not exceed more than 4000 mg of tylenol in a day if you do not have liver  disease.  Also note that there are may OTC medicines, such as cold medicines and allergy medicines that my contain tylenol as well.  If you have any questions about medications and/or interactions please ask your doctor/PA or your pharmacist.       ICE AND ELEVATE INJURED/OPERATIVE EXTREMITY  Using ice and elevating the injured extremity above your heart can help with swelling and pain control.  Icing in a pulsatile fashion, such as 20 minutes on and 20 minutes off, can be followed.    Do not place ice directly on skin. Make sure there is a barrier between to skin and the ice pack.    Using frozen items such as frozen peas works well as the conform nicely to the are that needs to be iced.  USE AN ACE WRAP OR TED HOSE FOR SWELLING CONTROL  In addition to icing and elevation, Ace wraps or TED hose are used to help limit and resolve swelling.  It is recommended to use Ace wraps or TED hose until you are informed to stop.    When using Ace Wraps start the wrapping distally (farthest away from the body) and wrap proximally (closer to the body)   Example: If you had surgery on your leg or thing and you do not have a splint on, start the ace wrap at the toes and work your way up to the thigh        If you had surgery on your upper extremity and do not have a splint on, start the ace wrap at your fingers and work your way up to the upper arm  IF YOU ARE IN A SPLINT OR CAST DO NOT REMOVE IT FOR ANY REASON   If your splint gets wet for any reason please contact the office immediately. You may shower in your splint or cast as long as you keep it dry.  This can be done by wrapping in a cast cover or garbage back (or similar)  Do Not stick any thing down your splint or cast such as pencils, money, or hangers to try and scratch yourself with.  If you feel itchy take benadryl as prescribed on the bottle for itching  IF YOU ARE IN A CAM BOOT (BLACK BOOT)  You may remove boot periodically. Perform daily dressing changes as noted below.  Wash the liner of the boot regularly and wear a sock when wearing the boot. It is recommended that you sleep in the boot until told otherwise    Call office for the following:  Temperature greater than  101F  Persistent nausea and vomiting  Severe uncontrolled pain  Redness, tenderness, or signs of infection (pain, swelling, redness, odor or green/yellow discharge around the site)  Difficulty breathing, headache or visual disturbances  Hives  Persistent dizziness or light-headedness  Extreme fatigue  Any other questions or concerns you may have after discharge  In an emergency, call 911 or go to an Emergency Department at a nearby hospital  HELPFUL INFORMATION  ? If you had a block, it will wear off between 8-24 hrs postop typically.  This is period when your pain may go from nearly zero to the pain you would have had postop without the block.  This is an abrupt transition but nothing dangerous is happening.  You may take an extra dose of narcotic when this happens.  ? You should wean off your narcotic medicines as soon as you are able.  Most patients will be off or using  minimal narcotics before their first postop appointment.   ? We suggest you use the pain medication the first night prior to going to bed, in order to ease any pain when the anesthesia wears off. You should avoid taking pain medications on an empty stomach as it will make you nauseous.  ? Do not drink alcoholic beverages or take illicit drugs when taking pain medications.  ? In most states it is against the law to drive while you are in a splint or sling.  And certainly against the law to drive while taking narcotics.  ? You may return to work/school in the next couple of days when you feel up to it.   ? Pain medication may make you constipated.  Below are a few solutions to try in this order: - Decrease the amount of pain medication if you arent having pain. - Drink lots of decaffeinated fluids. - Drink prune juice and/or each dried prunes  o If the first 3 dont work start with additional solutions - Take Colace - an over-the-counter stool softener - Take Senokot - an over-the-counter laxative - Take  Miralax - a stronger over-the-counter laxative     CALL THE OFFICE WITH ANY QUESTIONS OR CONCERNS: [phone]   VISIT OUR WEBSITE FOR ADDITIONAL INFORMATION: [email or web address]

## 2020-03-28 ENCOUNTER — Encounter (HOSPITAL_COMMUNITY): Payer: Self-pay | Admitting: Orthopedic Surgery

## 2020-06-11 ENCOUNTER — Other Ambulatory Visit: Payer: Self-pay

## 2020-06-11 ENCOUNTER — Ambulatory Visit (HOSPITAL_COMMUNITY)
Admission: EM | Admit: 2020-06-11 | Discharge: 2020-06-11 | Disposition: A | Discharge status: Home / Self Care | Payer: Self-pay | Attending: Urgent Care | Admitting: Urgent Care

## 2020-06-11 ENCOUNTER — Encounter (HOSPITAL_COMMUNITY): Payer: Self-pay

## 2020-06-11 DIAGNOSIS — R059 Cough, unspecified: Secondary | ICD-10-CM

## 2020-06-11 DIAGNOSIS — R0981 Nasal congestion: Secondary | ICD-10-CM

## 2020-06-11 DIAGNOSIS — F17200 Nicotine dependence, unspecified, uncomplicated: Secondary | ICD-10-CM

## 2020-06-11 DIAGNOSIS — Z20822 Contact with and (suspected) exposure to covid-19: Secondary | ICD-10-CM

## 2020-06-11 NOTE — ED Triage Notes (Signed)
Pt reports a coworker testing positive for COVID. He states he has been up and down under the weather and states he has had head pressure, cough. Pt states he did an at home test and states there was a faint positive result.

## 2020-06-11 NOTE — Discharge Instructions (Signed)

## 2020-06-11 NOTE — ED Triage Notes (Signed)
Pt was called, not answered.  

## 2020-06-11 NOTE — ED Provider Notes (Signed)
[facility]   MRN: [id number] DOB: 10-27-1985  Subjective:   Jerry Fischer is a 35 y.o. male presenting for 3 day history of acute onset sinus congestion, sinus headache, malaise, productive cough. Had COVID exposure with one of his co-workers. Took a home test and was positive. He is a light smoker. No history of asthma, chest pain, shob. He is COVID vaccinated.   No current facility-administered medications for this encounter.  Current Outpatient Medications:  .  albuterol (VENTOLIN HFA) 108 (90 Base) MCG/ACT inhaler, Inhale 1-2 puffs into the lungs every 6 (six) hours as needed for wheezing or shortness of breath., Disp: 18 g, Rfl: 0 .  ketorolac (TORADOL) 10 MG tablet, Take 1 tablet (10 mg total) by mouth every 6 (six) hours as needed for moderate pain., Disp: 20 tablet, Rfl: 0 .  traMADol (ULTRAM) 50 MG tablet, Take 1 tablet (50 mg total) by mouth every 12 (twelve) hours as needed for moderate pain., Disp: 20 tablet, Rfl: 0   No Known Allergies  Past Medical History:  Diagnosis Date  . Fracture    tibia and fibula, right  . History of substance abuse (HCC) 03/26/2020  . Pneumonia    x 1 as a child     Past Surgical History:  Procedure Laterality Date  . EXTERNAL FIXATION LEG Right 11/08/2019   Procedure: EXTERNAL FIXATION LEG;  Surgeon: Myrene Galas, MD;  Location: [facility] OR;  Service: Orthopedics;  Laterality: Right;  . EXTERNAL FIXATION LEG Right 11/29/2019   Procedure: EXTERNAL FIXATION LEG, removal;  Surgeon: Myrene Galas, MD;  Location: [facility] OR;  Service: Orthopedics;  Laterality: Right;  . HARDWARE REMOVAL Right 03/27/2020   Procedure: HARDWARE REMOVAL;  Surgeon: Myrene Galas, MD;  Location[facility] OR;  Service: Orthopedics;  Laterality: Right;  . ORIF ANKLE FRACTURE Right 11/29/2019   Procedure: OPEN REDUCTION INTERNAL FIXATION (ORIF) ANKLE FRACTURE;  Surgeon: Myrene Galas, MD;  Location: [facility] OR;  Service: Orthopedics;  Laterality: Right;  . TONSILLECTOMY     . WISDOM TOOTH EXTRACTION      Family History  Problem Relation Age of Onset  . Healthy Mother   . Healthy Father     Social History   Tobacco Use  . Smoking status: Former Smoker    Packs/day: 0.25    Years: 10.00    Pack years: 2.50    Types: Cigarettes    Quit date: 12/2019    Years since quitting: 0.4  . Smokeless tobacco: Never Used  Vaping Use  . Vaping Use: Never used  Substance Use Topics  . Alcohol use: No  . Drug use: No    ROS   Objective:   Vitals: BP 113/74   Pulse 100   Temp 98.4 F (36.9 C) (Oral)   Resp 17   SpO2 98%   Physical Exam Constitutional:      General: He is not in acute distress.    Appearance: Normal appearance. He is well-developed. He is not ill-appearing, toxic-appearing or diaphoretic.  HENT:     Head: Normocephalic and atraumatic.     Right Ear: External ear normal.     Left Ear: External ear normal.     Nose: Nose normal.     Mouth/Throat:     Mouth: Mucous membranes are moist.     Pharynx: Oropharynx is clear.  Eyes:     General: No scleral icterus.    Extraocular Movements: Extraocular movements intact.     Pupils: Pupils  are equal, round, and reactive to light.  Cardiovascular:     Rate and Rhythm: Normal rate and regular rhythm.     Heart sounds: Normal heart sounds. No murmur heard. No friction rub. No gallop.   Pulmonary:     Effort: Pulmonary effort is normal. No respiratory distress.     Breath sounds: Normal breath sounds. No stridor. No wheezing, rhonchi or rales.  Neurological:     Mental Status: He is alert and oriented to person, place, and time.  Psychiatric:        Mood and Affect: Mood normal.        Behavior: Behavior normal.        Thought Content: Thought content normal.      Assessment and Plan :   PDMP not reviewed this encounter.  1. Cough   2. Nasal congestion   3. Close exposure to COVID-19 virus   4. Smoker     Will manage COVID-19 with supportive care especially in light of  his exposure, positive home test which I saw from a picture patient demonstrated. Counseled patient on nature of COVID-19 including modes of transmission, diagnostic testing, management and supportive care.  Offered symptomatic relief but he declined, will use medications he already has at home. Counseled patient on potential for adverse effects with medications prescribed/recommended today, strict ER and return-to-clinic precautions discussed, patient verbalized understanding.     Jerry Fischer, [place] 06/11/20 1746

## 2020-06-12 LAB — SARS CORONAVIRUS 2 (TAT 6-24 HRS): SARS Coronavirus 2: NEGATIVE

## 2020-12-24 ENCOUNTER — Encounter (HOSPITAL_COMMUNITY): Payer: Self-pay | Admitting: Emergency Medicine

## 2020-12-24 ENCOUNTER — Ambulatory Visit (HOSPITAL_COMMUNITY)
Admission: EM | Admit: 2020-12-24 | Discharge: 2020-12-24 | Disposition: A | Discharge status: Home / Self Care | Payer: Self-pay | Attending: Emergency Medicine | Admitting: Emergency Medicine

## 2020-12-24 ENCOUNTER — Other Ambulatory Visit: Payer: Self-pay

## 2020-12-24 DIAGNOSIS — J209 Acute bronchitis, unspecified: Secondary | ICD-10-CM

## 2020-12-24 MED ORDER — PROMETHAZINE-DM 6.25-15 MG/5ML PO SYRP: 5.0000 mL | ORAL_SOLUTION | Freq: Four times a day (QID) | ORAL | 0 refills | Status: DC | PRN

## 2020-12-24 MED ORDER — PREDNISONE 20 MG PO TABS: 40.0000 mg | ORAL_TABLET | Freq: Every day | ORAL | 0 refills | Status: AC

## 2020-12-24 MED ORDER — BENZONATATE 100 MG PO CAPS: 100.0000 mg | ORAL_CAPSULE | Freq: Three times a day (TID) | ORAL | 0 refills | Status: DC

## 2020-12-24 NOTE — ED Provider Notes (Signed)
[facility]    CSN: [id number] Arrival date & time: 12/24/20  1502      History   Chief Complaint Chief Complaint  Patient presents with   Nasal Congestion   Cough   Sore Throat    HPI Jerry Fischer is a 35 y.o. male.   Patient here for evaluation of nasal congestion, cough, and sore throat that has been ongoing for the past several days.  Also reports having a fever at home.  Reports taking OTC medication with minimal symptom relief.  Denies any known sick contacts.  Denies any trauma, injury, or other precipitating event.  Denies any specific alleviating or aggravating factors.  Denies any chest pain, N/V/D, numbness, tingling, weakness, abdominal pain, or headaches.    The history is provided by the patient.  Cough Associated symptoms: fever, shortness of breath and sore throat   Sore Throat Associated symptoms include shortness of breath.   Past Medical History:  Diagnosis Date   Fracture    tibia and fibula, right   History of substance abuse (HCC) 03/26/2020   Pneumonia    x 1 as a child    Patient Active Problem List   Diagnosis Date Noted   History of substance abuse (HCC) 03/26/2020    Past Surgical History:  Procedure Laterality Date   EXTERNAL FIXATION LEG Right 11/08/2019   Procedure: EXTERNAL FIXATION LEG;  Surgeon: Myrene Galas, MD;  Location: [facility] OR;  Service: Orthopedics;  Laterality: Right;   EXTERNAL FIXATION LEG Right 11/29/2019   Procedure: EXTERNAL FIXATION LEG, removal;  Surgeon: Myrene Galas, MD;  Location: [facility] OR;  Service: Orthopedics;  Laterality: Right;   HARDWARE REMOVAL Right 03/27/2020   Procedure: HARDWARE REMOVAL;  Surgeon: Myrene Galas, MD;  Location: [facility] OR;  Service: Orthopedics;  Laterality: Right;   ORIF ANKLE FRACTURE Right 11/29/2019   Procedure: OPEN REDUCTION INTERNAL FIXATION (ORIF) ANKLE FRACTURE;  Surgeon: Myrene Galas, MD;  Location: [facility] OR;  Service: Orthopedics;  Laterality: Right;   TONSILLECTOMY     WISDOM  TOOTH EXTRACTION         Home Medications    Prior to Admission medications   Medication Sig Start Date End Date Taking? Authorizing Provider  benzonatate (TESSALON) 100 MG capsule Take 1 capsule (100 mg total) by mouth every 8 (eight) hours. 12/24/20  Yes Ivette Loyal, NP  predniSONE (DELTASONE) 20 MG tablet Take 2 tablets (40 mg total) by mouth daily for 5 days. 12/24/20 12/29/20 Yes Ivette Loyal, NP  promethazine-dextromethorphan (PROMETHAZINE-DM) 6.25-15 MG/5ML syrup Take 5 mLs by mouth 4 (four) times daily as needed for cough. 12/24/20  Yes Ivette Loyal, NP  albuterol (VENTOLIN HFA) 108 (90 Base) MCG/ACT inhaler Inhale 1-2 puffs into the lungs every 6 (six) hours as needed for wheezing or shortness of breath. 01/07/20   Particia Nearing, PA-C  ketorolac (TORADOL) 10 MG tablet Take 1 tablet (10 mg total) by mouth every 6 (six) hours as needed for moderate pain. 03/27/20   Montez Morita, PA-C  traMADol (ULTRAM) 50 MG tablet Take 1 tablet (50 mg total) by mouth every 12 (twelve) hours as needed for moderate pain. 03/27/20   Montez Morita, PA-C  fluticasone (FLONASE) 50 MCG/ACT nasal spray Place 2 sprays into both nostrils daily for 7 days. Patient not taking: Reported on 09/16/2018 05/23/18 02/21/19  Eustace Moore, MD    Family History Family History  Problem Relation Age of Onset   Healthy Mother    Healthy  Father     Social History Social History   Tobacco Use   Smoking status: Former    Packs/day: 0.25    Years: 10.00    Pack years: 2.50    Types: Cigarettes    Quit date: 12/2019    Years since quitting: 1.0   Smokeless tobacco: Never  Vaping Use   Vaping Use: Never used  Substance Use Topics   Alcohol use: No   Drug use: No     Allergies   Patient has no known allergies.   Review of Systems Review of Systems  Constitutional:  Positive for fever.  HENT:  Positive for congestion, sore throat and trouble swallowing.   Respiratory:  Positive for cough and  shortness of breath.   All other systems reviewed and are negative.   Physical Exam Triage Vital Signs ED Triage Vitals  Enc Vitals Group     BP 12/24/20 1518 103/69     Pulse Rate 12/24/20 1518 86     Resp 12/24/20 1518 16     Temp 12/24/20 1518 98.5 F (36.9 C)     Temp Source 12/24/20 1518 Oral     SpO2 12/24/20 1518 98 %     Weight --      Height --      Head Circumference --      Peak Flow --      Pain Score 12/24/20 1515 0     Pain Loc --      Pain Edu? --      Excl. in GC? --    No data found.  Updated Vital Signs BP 103/69 (BP Location: Left Arm)   Pulse 86   Temp 98.5 F (36.9 C) (Oral)   Resp 16   SpO2 98%   Visual Acuity Right Eye Distance:   Left Eye Distance:   Bilateral Distance:    Right Eye Near:   Left Eye Near:    Bilateral Near:     Physical Exam Vitals and nursing note reviewed.  Constitutional:      General: He is not in acute distress.    Appearance: Normal appearance. He is not ill-appearing, toxic-appearing or diaphoretic.  HENT:     Head: Normocephalic and atraumatic.     Right Ear: Tympanic membrane and ear canal normal.     Left Ear: Tympanic membrane and ear canal normal.     Nose: Congestion present. No rhinorrhea.     Mouth/Throat:     Mouth: Mucous membranes are moist.     Pharynx: Uvula midline. Pharyngeal swelling and posterior oropharyngeal erythema present.     Tonsils: No tonsillar exudate or tonsillar abscesses. 1+ on the right. 1+ on the left.  Eyes:     Conjunctiva/sclera: Conjunctivae normal.  Cardiovascular:     Rate and Rhythm: Normal rate and regular rhythm.     Pulses: Normal pulses.     Heart sounds: Normal heart sounds.  Pulmonary:     Effort: Pulmonary effort is normal.     Breath sounds: Normal breath sounds. No stridor. No wheezing, rhonchi or rales.  Abdominal:     General: Abdomen is flat.  Musculoskeletal:        General: Normal range of motion.     Cervical back: Normal range of motion.   Skin:    General: Skin is warm and dry.  Neurological:     General: No focal deficit present.     Mental Status: He is alert and oriented to  person, place, and time.  Psychiatric:        Mood and Affect: Mood normal.     UC Treatments / Results  Labs (all labs ordered are listed, but only abnormal results are displayed) Labs Reviewed - No data to display  EKG   Radiology No results found.  Procedures Procedures (including critical care time)  Medications Ordered in UC Medications - No data to display  Initial Impression / Assessment and Plan / UC Course  I have reviewed the triage vital signs and the nursing notes.  Pertinent labs & imaging results that were available during my care of the patient were reviewed by me and considered in my medical decision making (see chart for details).    Assessment negative for red flags or concerns.  Likely acute bronchitis.  Symptoms have been ongoing for the past several days this is most likely a viral illness.  We will treat with prednisone daily for the next 5 days starting tomorrow.  May take Tessalon Perles and Promethazine DM as needed for cough.  Struck to not to take Promethazine DM prior to driving as it can cause drowsiness.  Tylenol and/or ibuprofen as needed.  Encourage fluids and rest.  Discussed conservative symptom management as described in discharge instructions.  Follow-up as needed Final Clinical Impressions(s) / UC Diagnoses   Final diagnoses:  Acute bronchitis, unspecified organism     Discharge Instructions      Take the prednisone 2 pills once a day for the next 5 days and tomorrow.  Take this in the morning.  You can take the [facility] as needed for cough.  You may use the Promethazine DM as needed for cough at night.  Promethazine DM can make you sleepy so do not take it before driving.  You can take Tylenol and/or Ibuprofen as needed for fever reduction and pain relief.   For cough: honey 1/2 to  1 teaspoon (you can dilute the honey in water or another fluid).  You can use a humidifier for chest congestion and cough.  If you don't have a humidifier, you can sit in the bathroom with the hot shower running.     For sore throat: try warm salt water gargles, cepacol lozenges, throat spray, warm tea or water with lemon/honey, popsicles or ice, or OTC cold relief medicine for throat discomfort.    For congestion: take a daily anti-histamine like Zyrtec, Claritin, and a oral decongestant, such as pseudoephedrine.  You can also use Flonase 1-2 sprays in each nostril daily.    It is important to stay hydrated: drink plenty of fluids (water, gatorade/powerade/pedialyte, juices, or teas) to keep your throat moisturized and help further relieve irritation/discomfort.   Return or go to the Emergency Department if symptoms worsen or do not improve in the next few days.      ED Prescriptions     Medication Sig Dispense Auth. Provider   promethazine-dextromethorphan (PROMETHAZINE-DM) 6.25-15 MG/5ML syrup Take 5 mLs by mouth 4 (four) times daily as needed for cough. 118 mL Ivette Loyal, NP   benzonatate (TESSALON) 100 MG capsule Take 1 capsule (100 mg total) by mouth every 8 (eight) hours. 21 capsule Ivette Loyal, NP   predniSONE (DELTASONE) 20 MG tablet Take 2 tablets (40 mg total) by mouth daily for 5 days. 10 tablet Ivette Loyal, NP      PDMP not reviewed this encounter.   Ivette Loyal, NP 12/24/20 [phone]

## 2020-12-24 NOTE — ED Triage Notes (Signed)
Pt presents with congestion, cough, sore throat and fever Xs 3-4 days.

## 2020-12-24 NOTE — Discharge Instructions (Addendum)
Take the prednisone 2 pills once a day for the next 5 days and tomorrow.  Take this in the morning.  You can take the [facility] as needed for cough.  You may use the Promethazine DM as needed for cough at night.  Promethazine DM can make you sleepy so do not take it before driving.  You can take Tylenol and/or Ibuprofen as needed for fever reduction and pain relief.   For cough: honey 1/2 to 1 teaspoon (you can dilute the honey in water or another fluid).  You can use a humidifier for chest congestion and cough.  If you don't have a humidifier, you can sit in the bathroom with the hot shower running.     For sore throat: try warm salt water gargles, cepacol lozenges, throat spray, warm tea or water with lemon/honey, popsicles or ice, or OTC cold relief medicine for throat discomfort.    For congestion: take a daily anti-histamine like Zyrtec, Claritin, and a oral decongestant, such as pseudoephedrine.  You can also use Flonase 1-2 sprays in each nostril daily.    It is important to stay hydrated: drink plenty of fluids (water, gatorade/powerade/pedialyte, juices, or teas) to keep your throat moisturized and help further relieve irritation/discomfort.   Return or go to the Emergency Department if symptoms worsen or do not improve in the next few days.

## 2021-04-06 ENCOUNTER — Other Ambulatory Visit: Payer: Self-pay

## 2021-04-06 ENCOUNTER — Emergency Department (HOSPITAL_COMMUNITY): Payer: Self-pay

## 2021-04-06 ENCOUNTER — Encounter (HOSPITAL_COMMUNITY): Payer: Self-pay

## 2021-04-06 ENCOUNTER — Emergency Department (HOSPITAL_COMMUNITY)
Admission: EM | Admit: 2021-04-06 | Discharge: 2021-04-06 | Disposition: A | Discharge status: Home / Self Care | Payer: Self-pay | Attending: Emergency Medicine | Admitting: Emergency Medicine

## 2021-04-06 DIAGNOSIS — Y92410 Unspecified street and highway as the place of occurrence of the external cause: Secondary | ICD-10-CM | POA: Insufficient documentation

## 2021-04-06 DIAGNOSIS — S8001XA Contusion of right knee, initial encounter: Secondary | ICD-10-CM | POA: Insufficient documentation

## 2021-04-06 DIAGNOSIS — S0990XA Unspecified injury of head, initial encounter: Secondary | ICD-10-CM | POA: Insufficient documentation

## 2021-04-06 DIAGNOSIS — S80212A Abrasion, left knee, initial encounter: Secondary | ICD-10-CM | POA: Insufficient documentation

## 2021-04-06 DIAGNOSIS — S93401A Sprain of unspecified ligament of right ankle, initial encounter: Secondary | ICD-10-CM | POA: Insufficient documentation

## 2021-04-06 DIAGNOSIS — S60511A Abrasion of right hand, initial encounter: Secondary | ICD-10-CM | POA: Insufficient documentation
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
## 2021-04-06 NOTE — Discharge Instructions (Addendum)
We did quite extensive work-up for your injuries today, and fortunately it appears that you have not broken any bones in your surgical hardware remains in place.  However, given the swelling of your right knee and possible instability, I do recommend you follow-up with orthopedic doctor who may recommend further imaging.  I have attached information for Dr. Carola Frost here who did your previous surgeries.   The sleeve may help slightly with some of your instability.  I recommend using the boot that you have at home for your right ankle sprain.  Again I recommend resting, elevating the limb whenever you are lying down or sitting, ice for 15 to 20 minutes at a time several times a day, and using Tylenol or Motrin as needed for pain.
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
## 2021-04-06 NOTE — ED Notes (Signed)
Pt NAD, a/ox4. Pt verbalizes understanding of all DC and f/u instructions. All questions answered. Pt walks with steady gait to lobby at DC.  ? ?

## 2021-11-01 ENCOUNTER — Other Ambulatory Visit: Payer: Self-pay

## 2021-11-01 ENCOUNTER — Encounter (HOSPITAL_COMMUNITY): Payer: Self-pay | Admitting: Emergency Medicine

## 2021-11-01 ENCOUNTER — Emergency Department (HOSPITAL_COMMUNITY)
Admission: EM | Admit: 2021-11-01 | Discharge: 2021-11-01 | Disposition: A | Discharge status: Home / Self Care | Payer: Self-pay | Attending: Emergency Medicine | Admitting: Emergency Medicine

## 2021-11-01 DIAGNOSIS — Y92410 Unspecified street and highway as the place of occurrence of the external cause: Secondary | ICD-10-CM | POA: Insufficient documentation

## 2021-11-01 DIAGNOSIS — S060XAA Concussion with loss of consciousness status unknown, initial encounter: Secondary | ICD-10-CM | POA: Insufficient documentation

## 2021-11-01 MED ORDER — KETOROLAC TROMETHAMINE 15 MG/ML IJ SOLN
15.0000 mg | Freq: Once | INTRAMUSCULAR | Status: AC
  Administered 2021-11-01: 15 mg via INTRAMUSCULAR
  Filled 2021-11-01: qty 1

## 2021-11-01 MED ORDER — CYCLOBENZAPRINE HCL 10 MG PO TABS: 10.0000 mg | ORAL_TABLET | Freq: Two times a day (BID) | ORAL | 0 refills | Status: AC | PRN

## 2021-11-01 MED ORDER — LIDOCAINE 5 % EX PTCH
1.0000 | MEDICATED_PATCH | CUTANEOUS | Status: DC
  Administered 2021-11-01: 1 via TRANSDERMAL
  Filled 2021-11-01: qty 1

## 2021-11-01 MED ORDER — KETOROLAC TROMETHAMINE 15 MG/ML IJ SOLN: 15.0000 mg | Freq: Once | INTRAMUSCULAR | Status: DC

## 2021-11-01 MED ORDER — CYCLOBENZAPRINE HCL 10 MG PO TABS
5.0000 mg | ORAL_TABLET | Freq: Once | ORAL | Status: AC
Start: 2021-11-01 — End: 2021-11-01
  Administered 2021-11-01: 5 mg via ORAL
  Filled 2021-11-01: qty 1
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
## 2021-11-01 NOTE — Discharge Instructions (Addendum)
Today you were seen in the emergency department for your concussion.    At home, please take Tylenol and Motrin as needed for your pain.  You may also use over-the-counter lidocaine patches and the muscle relaxer that we have prescribed you (cyclobenzaprine).  Do not take the cyclobenzaprine prior to driving or operating heavy machinery as it may make you drowsy.    Follow-up with sports medicine in several days regarding your visit and concussion.  Return immediately to the emergency department if you experience any of the following: Vision changes, numbness or weakness of your arms or legs, or any other concerning symptoms.    Thank you for visiting our Emergency Department. It was a pleasure taking care of you today.

## 2022-01-24 ENCOUNTER — Ambulatory Visit (HOSPITAL_COMMUNITY)
Admission: EM | Admit: 2022-01-24 | Discharge: 2022-01-24 | Disposition: A | Discharge status: Home / Self Care | Payer: Self-pay | Attending: Family Medicine | Admitting: Family Medicine

## 2022-01-24 ENCOUNTER — Encounter (HOSPITAL_COMMUNITY): Payer: Self-pay

## 2022-01-24 DIAGNOSIS — J0100 Acute maxillary sinusitis, unspecified: Secondary | ICD-10-CM

## 2022-01-24 MED ORDER — AMOXICILLIN-POT CLAVULANATE 875-125 MG PO TABS: 1.0000 | ORAL_TABLET | Freq: Two times a day (BID) | ORAL | 0 refills | Status: DC

## 2022-01-24 MED ORDER — FLUTICASONE PROPIONATE 50 MCG/ACT NA SUSP: 2.0000 | Freq: Every day | NASAL | 2 refills | Status: DC

## 2022-01-24 MED ORDER — BENZONATATE 100 MG PO CAPS: 100.0000 mg | ORAL_CAPSULE | Freq: Three times a day (TID) | ORAL | 0 refills | Status: DC

## 2022-01-24 NOTE — ED Triage Notes (Signed)
C/O upper respiratory sx and cough with brownish sputum. Pt states that OTC remedies is not working. Pt is a smoker.

## 2022-01-24 NOTE — ED Provider Notes (Signed)
Jerry Fischer    CSN: [id number] Arrival date & time: 01/24/22  1531      History   Chief Complaint Chief Complaint  Patient presents with   Cough    HPI Jerry Fischer is a 36 y.o. male.  Patient presents due to sinus pressure and productive cough x 4 to 5 days.  Patient reports a worsening of sinus pain.  Patient reports brown sputum at times.  Patient denies fever or chills.  Patient reports fatigue.  Patient has taken over-the-counter medications with no relief of symptoms.  Patient reports the use of a vape occasionally.  Patient reports a history of seasonal allergies.    Cough Associated symptoms: rhinorrhea   Associated symptoms: no chills, no ear pain, no fever, no myalgias, no shortness of breath, no sore throat and no wheezing     Past Medical History:  Diagnosis Date   Fracture    tibia and fibula, right   History of substance abuse ([place]) 03/26/2020   Pneumonia    x 1 as a child    Patient Active Problem List   Diagnosis Date Noted   History of substance abuse ([place]) 03/26/2020    Past Surgical History:  Procedure Laterality Date   EXTERNAL FIXATION LEG Right 11/08/2019   Procedure: EXTERNAL FIXATION LEG;  Surgeon: Altamese Newcastle, MD;  Location: [place];  Service: Orthopedics;  Laterality: Right;   EXTERNAL FIXATION LEG Right 11/29/2019   Procedure: EXTERNAL FIXATION LEG, removal;  Surgeon: Altamese [place], MD;  Location: [place];  Service: Orthopedics;  Laterality: Right;   HARDWARE REMOVAL Right 03/27/2020   Procedure: HARDWARE REMOVAL;  Surgeon: Altamese Dunn Loring, MD;  Location: [place];  Service: Orthopedics;  Laterality: Right;   ORIF ANKLE FRACTURE Right 11/29/2019   Procedure: OPEN REDUCTION INTERNAL FIXATION (ORIF) ANKLE FRACTURE;  Surgeon: Altamese Eut[place], MD;  Location: [place];  Service: Orthopedics;  Laterality: Right;   TONSILLECTOMY     WISDOM TOOTH EXTRACTION         Home Medications    Prior to Admission medications   Medication Sig Start Date  End Date Taking? Authorizing Provider  amoxicillin-clavulanate (AUGMENTIN) 875-125 MG tablet Take 1 tablet by mouth every 12 (twelve) hours. 01/24/22  Yes Flossie Dibble, NP  benzonatate (TESSALON) 100 MG capsule Take 1 capsule (100 mg total) by mouth every 8 (eight) hours. 01/24/22  Yes Flossie Dibble, NP  fluticasone (FLONASE) 50 MCG/ACT nasal spray Place 2 sprays into both nostrils daily. 01/24/22  Yes Flossie Dibble, NP    Family History Family History  Problem Relation Age of Onset   Healthy Mother    Healthy Father     Social History Social History   Tobacco Use   Smoking status: Former    Packs/day: 0.25    Years: 10.00    Total pack years: 2.50    Types: Cigarettes    Quit date: 12/2019    Years since quitting: 2.0   Smokeless tobacco: Never  Vaping Use   Vaping Use: Some days  Substance Use Topics   Alcohol use: Yes    Comment: occasional   Drug use: No     Allergies   Patient has no known allergies.   Review of Systems Review of Systems  Constitutional:  Negative for activity change, appetite change, chills, fatigue and fever.  HENT:  Positive for congestion, postnasal drip, rhinorrhea, sinus pressure and sinus pain. Negative for ear discharge, ear pain, sore throat and trouble swallowing.  Eyes: Negative.   Respiratory:  Positive for cough. Negative for apnea, choking, chest tightness, shortness of breath, wheezing and stridor.   Cardiovascular: Negative.   Gastrointestinal:  Negative for abdominal pain, diarrhea, nausea and vomiting.  Musculoskeletal:  Negative for myalgias.     Physical Exam Triage Vital Signs ED Triage Vitals [01/24/22 1716]  Enc Vitals Group     BP 122/84     Pulse Rate 79     Resp 18     Temp 97.7 F (36.5 C)     Temp Source Oral     SpO2 98 %     Weight      Height      Head Circumference      Peak Flow      Pain Score 0     Pain Loc      Pain Edu?      Excl. in GC?    No data found.  Updated Vital  Signs BP 122/84 (BP Location: Right Arm)   Pulse 79   Temp 97.7 F (36.5 C) (Oral)   Resp 18   SpO2 98%    Physical Exam Vitals and nursing note reviewed.  HENT:     Right Ear: Hearing, tympanic membrane, ear canal and external ear normal.     Left Ear: Hearing, tympanic membrane, ear canal and external ear normal.     Nose: Congestion and rhinorrhea present. Rhinorrhea is purulent.     Right Turbinates: Not enlarged, swollen or pale.     Left Turbinates: Enlarged and swollen. Not pale.     Right Sinus: Maxillary sinus tenderness present.     Left Sinus: Maxillary sinus tenderness present.     Mouth/Throat:     Mouth: Mucous membranes are moist.     Pharynx: Oropharynx is clear. Uvula midline. No pharyngeal swelling, oropharyngeal exudate, posterior oropharyngeal erythema or uvula swelling.     Tonsils: No tonsillar exudate or tonsillar abscesses. 0 on the right. 0 on the left.  Cardiovascular:     Rate and Rhythm: Normal rate and regular rhythm.     Heart sounds: Normal heart sounds, S1 normal and S2 normal.  Pulmonary:     Effort: Pulmonary effort is normal.     Breath sounds: Normal breath sounds and air entry. No decreased breath sounds, wheezing, rhonchi or rales.  Lymphadenopathy:     Cervical: No cervical adenopathy.      UC Treatments / Results  Labs (all labs ordered are listed, but only abnormal results are displayed) Labs Reviewed - No data to display  EKG   Radiology No results found.  Procedures Procedures (including critical care time)  Medications Ordered in UC Medications - No data to display  Initial Impression / Assessment and Plan / UC Course  I have reviewed the triage vital signs and the nursing notes.  Pertinent labs & imaging results that were available during my care of the patient were reviewed by me and considered in my medical decision making (see chart for details).     Patient is evaluated for acute maxillary sinusitis.   Augmentin, Flonase, and Tessalon was sent to the pharmacy.  Patient was made aware of treatment regiment and possible side effects.  Patient was made aware of timeframe for symptom resolution and when follow-up would be necessary.  Patient verbalized understanding of instructions.  Final Clinical Impressions(s) / UC Diagnoses   Final diagnoses:  Acute non-recurrent maxillary sinusitis     Discharge Instructions  Augmentin has been sent to the pharmacy, you will take this 2 times a day for the next 7 days.  Please make sure to finish all of the antibiotics even if symptoms improve before completion.  Please avoid taking this medication on an empty stomach.   Flonase has been sent to the pharmacy, this is a nasal spray, you will take this medication 1 time daily, using 2 sprays in each nostril.   Tessalon has been sent to the pharmacy, you can use this medication for cough, you can take this every 8 hours as needed for cough.       ED Prescriptions     Medication Sig Dispense Auth. Provider   amoxicillin-clavulanate (AUGMENTIN) 875-125 MG tablet Take 1 tablet by mouth every 12 (twelve) hours. 14 tablet Alisha Burgo, Fidela Juneau, NP   fluticasone (FLONASE) 50 MCG/ACT nasal spray Place 2 sprays into both nostrils daily. 9.9 mL Flossie Dibble, NP   benzonatate (TESSALON) 100 MG capsule Take 1 capsule (100 mg total) by mouth every 8 (eight) hours. 16 capsule Flossie Dibble, NP      PDMP not reviewed this encounter.   Flossie Dibble, NP 01/24/22 1954

## 2022-01-24 NOTE — Discharge Instructions (Signed)
Augmentin has been sent to the pharmacy, you will take this 2 times a day for the next 7 days.  Please make sure to finish all of the antibiotics even if symptoms improve before completion.  Please avoid taking this medication on an empty stomach.   Flonase has been sent to the pharmacy, this is a nasal spray, you will take this medication 1 time daily, using 2 sprays in each nostril.   Tessalon has been sent to the pharmacy, you can use this medication for cough, you can take this every 8 hours as needed for cough.

## 2022-05-03 ENCOUNTER — Encounter (HOSPITAL_COMMUNITY): Payer: Self-pay | Admitting: *Deleted

## 2022-05-03 ENCOUNTER — Other Ambulatory Visit: Payer: Self-pay

## 2022-05-03 ENCOUNTER — Ambulatory Visit (HOSPITAL_COMMUNITY)
Admission: EM | Admit: 2022-05-03 | Discharge: 2022-05-03 | Disposition: A | Discharge status: Home / Self Care | Payer: Self-pay | Attending: Internal Medicine | Admitting: Internal Medicine

## 2022-05-03 DIAGNOSIS — U071 COVID-19: Secondary | ICD-10-CM

## 2022-05-03 MED ORDER — BENZONATATE 100 MG PO CAPS: 100.0000 mg | ORAL_CAPSULE | Freq: Three times a day (TID) | ORAL | 0 refills | Status: DC

## 2022-05-03 MED ORDER — PROMETHAZINE-DM 6.25-15 MG/5ML PO SYRP
5.0000 mL | ORAL_SOLUTION | Freq: Every evening | ORAL | 0 refills | Status: DC | PRN
Start: 2022-05-03 — End: 2022-05-10

## 2022-05-03 NOTE — ED Provider Notes (Signed)
[place]    CSN: [id number] Arrival date & time: 05/03/22  1616      History   Chief Complaint Chief Complaint  Patient presents with   Cough   Nasal Congestion   Generalized Body Aches    HPI Jerry Fischer is a 37 y.o. male.   Patient presents to urgent care for evaluation of cough, nasal congestion, generalized bodyaches, headache, and sore throat that started abruptly on Saturday, May 01, 2022 (2 days ago).  He took a COVID-19 test at home and it was positive.  Patient denies history of chronic respiratory problems or other chronic medical problems for which he takes daily medications.  He is not immunocompromised and has never been hospitalized related to COVID-19 in the past.  He is a former smoker and sometimes vapes currently.  Denies other drug use.  Cough is dry and nonproductive.  Sore throat is worsened by swallowing.  Denies dizziness, blurry vision, nausea, vomiting, abdominal pain, diarrhea, constipation, muffled voice sounds, decreased appetite, and lightheadedness.  He denies fever and chills.  States he believes he may have had COVID-19 in December 2023 as well but never tested for it (he was exposed to a known sick contact who tested positive for COVID-19).  He has been using over-the-counter medicines to help with symptoms with some relief.  No chest pain, shortness of breath, heart palpitations, or weakness.   Cough   Past Medical History:  Diagnosis Date   Fracture    tibia and fibula, right   History of substance abuse ([place]) 03/26/2020   Pneumonia    x 1 as a child    Patient Active Problem List   Diagnosis Date Noted   History of substance abuse ([place]) 03/26/2020    Past Surgical History:  Procedure Laterality Date   EXTERNAL FIXATION LEG Right 11/08/2019   Procedure: EXTERNAL FIXATION LEG;  Surgeon: Altamese Benicia, MD;  Location: [place];  Service: Orthopedics;  Laterality: Right;   EXTERNAL FIXATION LEG Right 11/29/2019   Procedure:  EXTERNAL FIXATION LEG, removal;  Surgeon: Altamese [place], MD;  Location: [place];  Service: Orthopedics;  Laterality: Right;   HARDWARE REMOVAL Right 03/27/2020   Procedure: HARDWARE REMOVAL;  Surgeon: Altamese Everton, MD;  Location: [place];  Service: Orthopedics;  Laterality: Right;   ORIF ANKLE FRACTURE Right 11/29/2019   Procedure: OPEN REDUCTION INTERNAL FIXATION (ORIF) ANKLE FRACTURE;  Surgeon: Altamese Hampshire, MD;  Location: [place];  Service: Orthopedics;  Laterality: Right;   TONSILLECTOMY     WISDOM TOOTH EXTRACTION         Home Medications    Prior to Admission medications   Medication Sig Start Date End Date Taking? Authorizing Provider  benzonatate (TESSALON) 100 MG capsule Take 1 capsule (100 mg total) by mouth every 8 (eight) hours. 05/03/22  Yes Talbot Grumbling, FNP  promethazine-dextromethorphan (PROMETHAZINE-DM) 6.25-15 MG/5ML syrup Take 5 mLs by mouth at bedtime as needed for cough. 05/03/22  Yes Talbot Grumbling, FNP  amoxicillin-clavulanate (AUGMENTIN) 875-125 MG tablet Take 1 tablet by mouth every 12 (twelve) hours. 01/24/22   Flossie Dibble, NP  fluticasone (FLONASE) 50 MCG/ACT nasal spray Place 2 sprays into both nostrils daily. 01/24/22   Flossie Dibble, NP    Family History Family History  Problem Relation Age of Onset   Healthy Mother    Healthy Father     Social History Social History   Tobacco Use   Smoking status: Former    Packs/day:  0.25    Years: 10.00    Total pack years: 2.50    Types: Cigarettes    Quit date: 12/2019    Years since quitting: 2.3   Smokeless tobacco: Never  Vaping Use   Vaping Use: Some days  Substance Use Topics   Alcohol use: Yes    Comment: occasional   Drug use: No     Allergies   Patient has no known allergies.   Review of Systems Review of Systems  Respiratory:  Positive for cough.   Per HPI   Physical Exam Triage Vital Signs ED Triage Vitals  Enc Vitals Group     BP 05/03/22 1839 108/72      Pulse Rate 05/03/22 1839 78     Resp 05/03/22 1839 18     Temp 05/03/22 1839 99 F (37.2 C)     Temp src --      SpO2 05/03/22 1839 94 %     Weight --      Height --      Head Circumference --      Peak Flow --      Pain Score 05/03/22 1837 0     Pain Loc --      Pain Edu? --      Excl. in [place]? --    No data found.  Updated Vital Signs BP 108/72   Pulse 78   Temp 99 F (37.2 C)   Resp 18   SpO2 94%   Visual Acuity Right Eye Distance:   Left Eye Distance:   Bilateral Distance:    Right Eye Near:   Left Eye Near:    Bilateral Near:     Physical Exam Vitals and nursing note reviewed.  Constitutional:      Appearance: Normal appearance. He is not ill-appearing or toxic-appearing.  HENT:     Head: Normocephalic and atraumatic.     Right Ear: Hearing, tympanic membrane, ear canal and external ear normal.     Left Ear: Hearing, tympanic membrane, ear canal and external ear normal.     Nose: Congestion present.     Mouth/Throat:     Lips: Pink.     Mouth: Mucous membranes are moist. No injury.     Tongue: No lesions. Tongue does not deviate from midline.     Palate: No mass and lesions.     Pharynx: Oropharynx is clear. Uvula midline. Posterior oropharyngeal erythema present. No pharyngeal swelling, oropharyngeal exudate or uvula swelling.     Tonsils: No tonsillar exudate or tonsillar abscesses.  Eyes:     General: Lids are normal. Vision grossly intact. Gaze aligned appropriately.        Right eye: No discharge.        Left eye: No discharge.     Extraocular Movements: Extraocular movements intact.     Conjunctiva/sclera: Conjunctivae normal.     Pupils: Pupils are equal, round, and reactive to light.  Cardiovascular:     Rate and Rhythm: Normal rate and regular rhythm.     Heart sounds: Normal heart sounds, S1 normal and S2 normal.  Pulmonary:     Effort: Pulmonary effort is normal. No respiratory distress.     Breath sounds: Normal breath sounds and air  entry. No wheezing, rhonchi or rales.  Musculoskeletal:     Cervical back: Neck supple.  Lymphadenopathy:     Cervical: No cervical adenopathy.  Skin:    General: Skin is warm and dry.  Capillary Refill: Capillary refill takes less than 2 seconds.     Findings: No rash.  Neurological:     General: No focal deficit present.     Mental Status: He is alert and oriented to person, place, and time. Mental status is at baseline.     Cranial Nerves: No dysarthria or facial asymmetry.  Psychiatric:        Mood and Affect: Mood normal.        Speech: Speech normal.        Behavior: Behavior normal.        Thought Content: Thought content normal.        Judgment: Judgment normal.      UC Treatments / Results  Labs (all labs ordered are listed, but only abnormal results are displayed) Labs Reviewed - No data to display  EKG   Radiology No results found.  Procedures Procedures (including critical care time)  Medications Ordered in UC Medications - No data to display  Initial Impression / Assessment and Plan / UC Course  I have reviewed the triage vital signs and the nursing notes.  Pertinent labs & imaging results that were available during my care of the patient were reviewed by me and considered in my medical decision making (see chart for details).   1. COVID-19 Symptoms and physical exam consistent with a viral upper respiratory tract infection that will likely resolve with rest, fluids, and prescriptions for symptomatic relief. Deferred imaging based on stable cardiopulmonary exam and hemodynamically stable vital signs.   Tessalon Perles and Promethazine DM sent to pharmacy for symptomatic relief to be taken as prescribed.  May continue taking over the counter medications as directed for further symptomatic relief.  Drowsiness precautions discussed regarding promethazine DM prescription.  Nonpharmacologic interventions for symptom relief provided and after visit summary  below. Advised to push fluids to stay well hydrated while recovering from viral illness.   Discussed physical exam and available lab work findings in clinic with patient.  Counseled patient regarding appropriate use of medications and potential side effects for all medications recommended or prescribed today. Discussed red flag signs and symptoms of worsening condition,when to call the PCP office, return to urgent care, and when to seek higher level of care in the emergency department. Patient verbalizes understanding and agreement with plan. All questions answered. Patient discharged in stable condition.    Final Clinical Impressions(s) / UC Diagnoses   Final diagnoses:  [id number]     Discharge Instructions      You have COVID-19.   Use the following medicines to help with symptoms: - Plain Mucinex (guaifenesin) over the counter as directed every 12 hours to thin mucous so that you are able to get it out of your body easier. Drink plenty of water while taking this medication so that it works well in your body (at least 8 cups a day).  - Tylenol 1,076m and/or ibuprofen 6071mevery 6 hours with food as needed for aches/pains or fever/chills.  - Tessalon perles every 8 hours as needed for cough. - Take Promethazine DM cough medication to help with your cough at nighttime so that you are able to sleep. Do not drive, drink alcohol, or go to work while taking this medication since it can make you sleepy. Only take this at nighttime.   1 tablespoon of honey in warm water and/or salt water gargles may also help with symptoms. Humidifier to your room will help add water to the air and reduce coughing.  If you develop any new or worsening symptoms, please return.  If your symptoms are severe, please go to the emergency room.  Follow-up with your primary care provider for further evaluation and management of your symptoms as well as ongoing wellness visits.  I hope you feel better!    ED  Prescriptions     Medication Sig Dispense Auth. Provider   promethazine-dextromethorphan (PROMETHAZINE-DM) 6.25-15 MG/5ML syrup Take 5 mLs by mouth at bedtime as needed for cough. 118 mL Joella Prince M, FNP   benzonatate (TESSALON) 100 MG capsule Take 1 capsule (100 mg total) by mouth every 8 (eight) hours. 21 capsule Talbot Grumbling, FNP      PDMP not reviewed this encounter.   Talbot Grumbling, [place] 05/03/22 1947

## 2022-05-03 NOTE — Discharge Instructions (Signed)
You have COVID-19.   Use the following medicines to help with symptoms: - Plain Mucinex (guaifenesin) over the counter as directed every 12 hours to thin mucous so that you are able to get it out of your body easier. Drink plenty of water while taking this medication so that it works well in your body (at least 8 cups a day).  - Tylenol 1,016m and/or ibuprofen 6050mevery 6 hours with food as needed for aches/pains or fever/chills.  - Tessalon perles every 8 hours as needed for cough. - Take Promethazine DM cough medication to help with your cough at nighttime so that you are able to sleep. Do not drive, drink alcohol, or go to work while taking this medication since it can make you sleepy. Only take this at nighttime.   1 tablespoon of honey in warm water and/or salt water gargles may also help with symptoms. Humidifier to your room will help add water to the air and reduce coughing.  If you develop any new or worsening symptoms, please return.  If your symptoms are severe, please go to the emergency room.  Follow-up with your primary care provider for further evaluation and management of your symptoms as well as ongoing wellness visits.  I hope you feel better!

## 2022-05-03 NOTE — ED Triage Notes (Signed)
Pt reports having general body aches,cough and COVID.

## 2022-05-10 ENCOUNTER — Ambulatory Visit (HOSPITAL_COMMUNITY)
Admission: EM | Admit: 2022-05-10 | Discharge: 2022-05-10 | Disposition: A | Discharge status: Home / Self Care | Payer: Self-pay | Attending: Family Medicine | Admitting: Family Medicine

## 2022-05-10 ENCOUNTER — Ambulatory Visit (INDEPENDENT_AMBULATORY_CARE_PROVIDER_SITE_OTHER): Payer: Self-pay

## 2022-05-10 ENCOUNTER — Encounter (HOSPITAL_COMMUNITY): Payer: Self-pay

## 2022-05-10 DIAGNOSIS — R059 Cough, unspecified: Secondary | ICD-10-CM

## 2022-05-10 DIAGNOSIS — J209 Acute bronchitis, unspecified: Secondary | ICD-10-CM

## 2022-05-10 DIAGNOSIS — J019 Acute sinusitis, unspecified: Secondary | ICD-10-CM

## 2022-05-10 DIAGNOSIS — R509 Fever, unspecified: Secondary | ICD-10-CM

## 2022-05-10 MED ORDER — CHERATUSSIN AC 100-10 MG/5ML PO SOLN
5.0000 mL | Freq: Four times a day (QID) | ORAL | 0 refills | Status: DC | PRN
Start: 2022-05-10 — End: 2022-05-17

## 2022-05-10 MED ORDER — PREDNISONE 20 MG PO TABS: 40.0000 mg | ORAL_TABLET | Freq: Every day | ORAL | 0 refills | Status: AC

## 2022-05-10 MED ORDER — CEFDINIR 300 MG PO CAPS: 600.0000 mg | ORAL_CAPSULE | Freq: Every day | ORAL | 0 refills | Status: DC

## 2022-05-10 NOTE — ED Provider Notes (Signed)
[place]    CSN: [id number] Arrival date & time: 05/10/22  1117      History   Chief Complaint Chief Complaint  Patient presents with   Cough    Covid 19 - Entered by patient    HPI Jerry Fischer is a 37 y.o. male.    Cough  With cough and congestion.  On February 10 he began having congestion and cough and fever.  He did 2 home COVID test that were positive on February 12 and was seen here February 12.  As he had no risk factors for severe COVID disease, he was not prescribed an antiviral.  Symptomatic treatment was sent in with Phenergan with dextromethorphan.  He did improve some on about February 13 and 14, and then February 15 he began coughing worse again and began having fever.  Highest temperature in the last few days has been 100.5.  He has been a little nauseated but no vomiting or diarrhea.  No history of asthma.  The Phenergan with dextromethorphan is helping just a little bit.  He states [employer] do not usually help him.  He has had some sinus pressure and a lot of postnasal drainage and nasal congestion in the last 3 days  Past Medical History:  Diagnosis Date   Fracture    tibia and fibula, right   Pneumonia    x 1 as a child    Patient Active Problem List   Diagnosis Date Noted   History of substance abuse ([place]) 03/26/2020    Past Surgical History:  Procedure Laterality Date   EXTERNAL FIXATION LEG Right 11/08/2019   Procedure: EXTERNAL FIXATION LEG;  Surgeon: Altamese Homeland, MD;  Location: [place];  Service: Orthopedics;  Laterality: Right;   EXTERNAL FIXATION LEG Right 11/29/2019   Procedure: EXTERNAL FIXATION LEG, removal;  Surgeon: Altamese [place], MD;  Location: [place];  Service: Orthopedics;  Laterality: Right;   HARDWARE REMOVAL Right 03/27/2020   Procedure: HARDWARE REMOVAL;  Surgeon: Altamese Bullhead, MD;  Location: [place];  Service: Orthopedics;  Laterality: Right;   ORIF ANKLE FRACTURE Right 11/29/2019   Procedure: OPEN  REDUCTION INTERNAL FIXATION (ORIF) ANKLE FRACTURE;  Surgeon: Altamese Trainer, MD;  Location: [place];  Service: Orthopedics;  Laterality: Right;   TONSILLECTOMY     WISDOM TOOTH EXTRACTION         Home Medications    Prior to Admission medications   Medication Sig Start Date End Date Taking? Authorizing Provider  cefdinir (OMNICEF) 300 MG capsule Take 2 capsules (600 mg total) by mouth daily for 7 days. 05/10/22 05/17/22 Yes Trevonte Ashkar, Gwenlyn Perking, MD  guaiFENesin-codeine (CHERATUSSIN AC) 100-10 MG/5ML syrup Take 5 mLs by mouth 4 (four) times daily as needed for cough. 05/10/22  Yes Barrett Henle, MD  predniSONE (DELTASONE) 20 MG tablet Take 2 tablets (40 mg total) by mouth daily with breakfast for 5 days. 05/10/22 05/15/22 Yes Darion Juhasz, Gwenlyn Perking, MD    Family History Family History  Problem Relation Age of Onset   Healthy Mother    Healthy Father     Social History Social History   Tobacco Use   Smoking status: Former    Packs/day: 0.25    Years: 10.00    Total pack years: 2.50    Types: Cigarettes    Quit date: 12/2019    Years since quitting: 2.3   Smokeless tobacco: Never  Vaping Use   Vaping Use: Some days  Substance Use Topics  Alcohol use: Yes    Comment: occasional   Drug use: No     Allergies   Patient has no known allergies.   Review of Systems Review of Systems  Respiratory:  Positive for cough.      Physical Exam Triage Vital Signs ED Triage Vitals  Enc Vitals Group     BP 05/10/22 1329 124/87     Pulse Rate 05/10/22 1329 81     Resp 05/10/22 1329 16     Temp 05/10/22 1329 98.3 F (36.8 C)     Temp Source 05/10/22 1329 Oral     SpO2 --      Weight --      Height --      Head Circumference --      Peak Flow --      Pain Score 05/10/22 1330 5     Pain Loc --      Pain Edu? --      Excl. in [place]? --    No data found.  Updated Vital Signs BP 124/87 (BP Location: Left Arm)   Pulse 81   Temp 98.3 F (36.8 C) (Oral)   Resp 16   Visual  Acuity Right Eye Distance:   Left Eye Distance:   Bilateral Distance:    Right Eye Near:   Left Eye Near:    Bilateral Near:     Physical Exam Vitals reviewed.  Constitutional:      General: He is not in acute distress.    Appearance: He is not toxic-appearing.  HENT:     Right Ear: Tympanic membrane and ear canal normal.     Left Ear: Tympanic membrane and ear canal normal.     Nose: Nose normal.     Mouth/Throat:     Mouth: Mucous membranes are moist.     Pharynx: No oropharyngeal exudate or posterior oropharyngeal erythema.  Eyes:     Extraocular Movements: Extraocular movements intact.     Conjunctiva/sclera: Conjunctivae normal.     Pupils: Pupils are equal, round, and reactive to light.  Cardiovascular:     Rate and Rhythm: Normal rate and regular rhythm.     Heart sounds: No murmur heard. Pulmonary:     Effort: No respiratory distress.     Breath sounds: No stridor. No wheezing, rhonchi or rales.  Musculoskeletal:     Cervical back: Neck supple.  Lymphadenopathy:     Cervical: No cervical adenopathy.  Skin:    Capillary Refill: Capillary refill takes less than 2 seconds.     Coloration: Skin is not jaundiced or pale.  Neurological:     General: No focal deficit present.     Mental Status: He is alert and oriented to person, place, and time.  Psychiatric:        Behavior: Behavior normal.      UC Treatments / Results  Labs (all labs ordered are listed, but only abnormal results are displayed) Labs Reviewed - No data to display  EKG   Radiology DG Chest 2 View  Result Date: 05/10/2022 CLINICAL DATA:  Provided history: Cough and fever. COVID diagnosis February 12th. EXAM: CHEST - 2 VIEW COMPARISON:  Prior chest radiographs 06/27/2019 and earlier. FINDINGS: Heart size within normal limits. No appreciable airspace consolidation. No evidence of pleural effusion or pneumothorax. No acute osseous abnormality identified. IMPRESSION: No evidence of active  cardiopulmonary disease. Electronically Signed   By: Kellie Simmering D.O.   On: 05/10/2022 14:19    Procedures  Procedures (including critical care time)  Medications Ordered in UC Medications - No data to display  Initial Impression / Assessment and Plan / UC Course  I have reviewed the triage vital signs and the nursing notes.  Pertinent labs & imaging results that were available during my care of the patient were reviewed by me and considered in my medical decision making (see chart for details).        Chest x-ray does not show any pneumonia.  I am going to treat with 5 days of prednisone and also with antibiotics for possible acute sinusitis.  He has been ill with the symptoms for a total of 9 days, and he has had a double sickening.  Reviewed the patient's prior history with him.  There was a diagnosis of "history of substance abuse" that been placed in his history on March 26, 2020.  There is no associated history and physical or note that states that this is the case or any more specifics than that.  The encounter in his chart for March 26, 2020, is for a preanesthesia COVID test.  I am deleting this from his prior history as I think it is an accurate.  Of note, there is nothing on his PMP history when it is reviewed today Final Clinical Impressions(s) / UC Diagnoses   Final diagnoses:  Acute sinusitis, recurrence not specified, unspecified location  Acute bronchitis, unspecified organism     Discharge Instructions       Your chest x-ray does not show pneumonia or fluid  Take cefdinir 300 mg--2 capsules together daily for 7 days  Robitussin with codeine cough syrup--take 5 mL or 1 teaspoon every 6 hours as needed for cough.  Take prednisone 20 mg--2 daily for 5 days      ED Prescriptions     Medication Sig Dispense Auth. Provider   cefdinir (OMNICEF) 300 MG capsule Take 2 capsules (600 mg total) by mouth daily for 7 days. 14 capsule Herron Fero, Gwenlyn Perking, MD    guaiFENesin-codeine (CHERATUSSIN AC) 100-10 MG/5ML syrup Take 5 mLs by mouth 4 (four) times daily as needed for cough. 120 mL Barrett Henle, MD   predniSONE (DELTASONE) 20 MG tablet Take 2 tablets (40 mg total) by mouth daily with breakfast for 5 days. 10 tablet Windy Carina Gwenlyn Perking, MD      I have reviewed the PDMP during this encounter.   Barrett Henle, MD 05/10/22 [phone]

## 2022-05-10 NOTE — ED Triage Notes (Signed)
Patient c/o intermittent fever, cough and only suppressed when he takes his medication at night, body aches, chills x 1 week. Patient states he was seen a week ago with the same symptoms and has tested Covid + x 2 during this week.  Patient  states he took Tylenol at 0730 this AM.

## 2022-05-10 NOTE — Discharge Instructions (Signed)
Your chest x-ray does not show pneumonia or fluid  Take cefdinir 300 mg--2 capsules together daily for 7 days  Robitussin with codeine cough syrup--take 5 mL or 1 teaspoon every 6 hours as needed for cough.  Take prednisone 20 mg--2 daily for 5 days

## 2022-05-17 ENCOUNTER — Encounter (HOSPITAL_COMMUNITY): Payer: Self-pay

## 2022-05-17 ENCOUNTER — Ambulatory Visit (INDEPENDENT_AMBULATORY_CARE_PROVIDER_SITE_OTHER): Payer: Self-pay

## 2022-05-17 ENCOUNTER — Ambulatory Visit (HOSPITAL_COMMUNITY)
Admission: RE | Admit: 2022-05-17 | Discharge: 2022-05-17 | Disposition: A | Discharge status: Home / Self Care | Payer: Self-pay | Source: Ambulatory Visit | Attending: Family Medicine | Admitting: Family Medicine

## 2022-05-17 VITALS — BP 107/70 | HR 81 | Temp 97.9°F | Resp 17

## 2022-05-17 DIAGNOSIS — M545 Low back pain, unspecified: Secondary | ICD-10-CM

## 2022-05-17 DIAGNOSIS — W19XXXA Unspecified fall, initial encounter: Secondary | ICD-10-CM

## 2022-05-17 MED ORDER — HYDROCODONE-ACETAMINOPHEN 5-325 MG PO TABS
2.0000 | ORAL_TABLET | ORAL | 0 refills | Status: DC | PRN
Start: 2022-05-17 — End: 2022-06-22

## 2022-05-17 MED ORDER — KETOROLAC TROMETHAMINE 30 MG/ML IJ SOLN
INTRAMUSCULAR | Status: AC
  Filled 2022-05-17: qty 1

## 2022-05-17 MED ORDER — KETOROLAC TROMETHAMINE 30 MG/ML IJ SOLN
30.0000 mg | Freq: Once | INTRAMUSCULAR | Status: AC
  Administered 2022-05-17: 30 mg via INTRAMUSCULAR

## 2022-05-17 MED ORDER — CYCLOBENZAPRINE HCL 10 MG PO TABS: 10.0000 mg | ORAL_TABLET | Freq: Two times a day (BID) | ORAL | 0 refills | Status: DC | PRN

## 2022-05-17 NOTE — ED Triage Notes (Signed)
Pt c/o lower back pain since yesterday when putting boxes up in attic and slipped and fel onto back on the hardwood floors.  Took tylenol and ibuprofen

## 2022-05-17 NOTE — ED Provider Notes (Signed)
[place]    CSN: [id number] Arrival date & time: 05/17/22  1225      History   Chief Complaint Chief Complaint  Patient presents with   Back Pain    Severe back pain will not stop. - Entered by patient   appt 1230    HPI Jerry Fischer is a 37 y.o. male.   He is here for back pain.  He was coming down from the attic.  About half way down and he fell backward onto the hardwood floor.  Fell about 4-5 feet.  Had immediate pain.  Still will severe pain.  Feels like compressing in the low back with sitting or walking.  No pain or numbness into the buttocks or legs.  Some pain into the left lower back as well.  He has been taking tylenol.        Past Medical History:  Diagnosis Date   Fracture    tibia and fibula, right   Pneumonia    x 1 as a child    Patient Active Problem List   Diagnosis Date Noted   History of substance abuse ([place]) 03/26/2020    Past Surgical History:  Procedure Laterality Date   EXTERNAL FIXATION LEG Right 11/08/2019   Procedure: EXTERNAL FIXATION LEG;  Surgeon: Altamese [place], MD;  Location: [facility];  Service: Orthopedics;  Laterality: Right;   EXTERNAL FIXATION LEG Right 11/29/2019   Procedure: EXTERNAL FIXATION LEG, removal;  Surgeon: Altamese [place], MD;  Location: [facility];  Service: Orthopedics;  Laterality: Right;   HARDWARE REMOVAL Right 03/27/2020   Procedure: HARDWARE REMOVAL;  Surgeon: Altamese [place], MD;  Location: [facility];  Service: Orthopedics;  Laterality: Right;   ORIF ANKLE FRACTURE Right 11/29/2019   Procedure: OPEN REDUCTION INTERNAL FIXATION (ORIF) ANKLE FRACTURE;  Surgeon: Altamese [place], MD;  Location: [facility];  Service: Orthopedics;  Laterality: Right;   TONSILLECTOMY     WISDOM TOOTH EXTRACTION         Home Medications    Prior to Admission medications   Medication Sig Start Date End Date Taking? Authorizing Provider  cefdinir (OMNICEF) 300 MG capsule Take 2 capsules (600 mg total) by mouth daily for 7 days.  05/10/22 05/17/22  Barrett Henle, MD  guaiFENesin-codeine (CHERATUSSIN AC) 100-10 MG/5ML syrup Take 5 mLs by mouth 4 (four) times daily as needed for cough. 05/10/22   Barrett Henle, MD    Family History Family History  Problem Relation Age of Onset   Healthy Mother    Healthy Father     Social History Social History   Tobacco Use   Smoking status: Former    Packs/day: 0.25    Years: 10.00    Total pack years: 2.50    Types: Cigarettes    Quit date: 12/2019    Years since quitting: 2.4   Smokeless tobacco: Never  Vaping Use   Vaping Use: Some days  Substance Use Topics   Alcohol use: Yes    Comment: occasional   Drug use: No     Allergies   Patient has no known allergies.   Review of Systems Review of Systems  Constitutional: Negative.   HENT: Negative.    Respiratory: Negative.    Cardiovascular: Negative.   Gastrointestinal: Negative.   Musculoskeletal:  Positive for back pain.  Psychiatric/Behavioral: Negative.       Physical Exam Triage Vital Signs ED Triage Vitals  Enc Vitals Group     BP 05/17/22 1257 107/70  Pulse Rate 05/17/22 1257 81     Resp 05/17/22 1257 17     Temp 05/17/22 1257 97.9 F (36.6 C)     Temp src --      SpO2 05/17/22 1257 97 %     Weight --      Height --      Head Circumference --      Peak Flow --      Pain Score 05/17/22 1255 8     Pain Loc --      Pain Edu? --      Excl. in [place]? --    No data found.  Updated Vital Signs BP 107/70 (BP Location: Left Arm)   Pulse 81   Temp 97.9 F (36.6 C)   Resp 17   SpO2 97%   Visual Acuity Right Eye Distance:   Left Eye Distance:   Bilateral Distance:    Right Eye Near:   Left Eye Near:    Bilateral Near:     Physical Exam Constitutional:      Appearance: Normal appearance.  Cardiovascular:     Rate and Rhythm: Normal rate and regular rhythm.  Pulmonary:     Effort: Pulmonary effort is normal.  Musculoskeletal:     Comments: TTP to the lumbar spine;   TTP left of the spine, paraspinally;  decreased rom due to pain;   Skin:    General: Skin is warm.  Neurological:     General: No focal deficit present.     Mental Status: He is alert.  Psychiatric:        Mood and Affect: Mood normal.      UC Treatments / Results  Labs (all labs ordered are listed, but only abnormal results are displayed) Labs Reviewed - No data to display  EKG   Radiology DG Lumbar Spine Complete  Result Date: 05/17/2022 CLINICAL DATA:  Back pain after a fall. EXAM: LUMBAR SPINE - COMPLETE 4+ VIEW COMPARISON:  Lumbar spine radiographs 10/31/2017 FINDINGS: There are 5 non-rib-bearing lumbar-type vertebral bodies. Vertebral body heights are preserved, without evidence of acute fracture. Alignment is normal. There is no evidence of spondylolysis. The disc spaces are preserved. There is no significant degenerative change. The SI joints and symphysis pubis are intact. The soft tissues are unremarkable. IMPRESSION: Normal lumbar spine radiographs. Electronically Signed   By: Valetta Mole M.D.   On: 05/17/2022 13:59    Procedures Procedures (including critical care time)  Medications Ordered in UC Medications  ketorolac (TORADOL) 30 MG/ML injection 30 mg (30 mg Intramuscular Given 05/17/22 1323)    Initial Impression / Assessment and Plan / UC Course  I have reviewed the triage vital signs and the nursing notes.  Pertinent labs & imaging results that were available during my care of the patient were reviewed by me and considered in my medical decision making (see chart for details).  Patient seen today for back pain after a fall.  Xray was normal today.  Pain meds and muscle relaxer given.  Discussed that if he continues to have pain he likely needs further imaging. He does not have a PCP, so if needed he should go to the ER for further evaluation.     Final Clinical Impressions(s) / UC Diagnoses   Final diagnoses:  Acute midline low back pain without sciatica      Discharge Instructions      You were seen today for back pain after fall.  Your xray was normal.  I have sent out a pain medication and muscle relaxer, both of which could make you tired/sleepy so please take when home and not driving.  You may use heat/ice.  If you continue to have severe pain that is not improving then please follow up with a primary care provider or go to the ER for further evaluation.     ED Prescriptions     Medication Sig Dispense Auth. Provider   cyclobenzaprine (FLEXERIL) 10 MG tablet Take 1 tablet (10 mg total) by mouth 2 (two) times daily as needed for muscle spasms. 20 tablet Ryver Poblete, MD   HYDROcodone-acetaminophen (NORCO/VICODIN) 5-325 MG tablet Take 2 tablets by mouth every 4 (four) hours as needed. 10 tablet Rondel Oh, MD      PDMP not reviewed this encounter.   Rondel Oh, MD 05/17/22 [id number]

## 2022-05-17 NOTE — Discharge Instructions (Signed)
You were seen today for back pain after fall.  Your xray was normal.  I have sent out a pain medication and muscle relaxer, both of which could make you tired/sleepy so please take when home and not driving.  You may use heat/ice.  If you continue to have severe pain that is not improving then please follow up with a primary care provider or go to the ER for further evaluation.

## 2022-06-22 ENCOUNTER — Encounter (HOSPITAL_COMMUNITY): Payer: Self-pay

## 2022-06-22 ENCOUNTER — Ambulatory Visit (HOSPITAL_COMMUNITY)
Admission: RE | Admit: 2022-06-22 | Discharge: 2022-06-22 | Disposition: A | Discharge status: Home / Self Care | Payer: Self-pay | Source: Ambulatory Visit | Attending: Physician Assistant | Admitting: Physician Assistant

## 2022-06-22 VITALS — BP 116/79 | HR 96 | Temp 98.1°F | Resp 16

## 2022-06-22 DIAGNOSIS — J209 Acute bronchitis, unspecified: Secondary | ICD-10-CM

## 2022-06-22 DIAGNOSIS — J029 Acute pharyngitis, unspecified: Secondary | ICD-10-CM

## 2022-06-22 DIAGNOSIS — R051 Acute cough: Secondary | ICD-10-CM

## 2022-06-22 LAB — POCT RAPID STREP A, ED / UC: Streptococcus, Group A Screen (Direct): NEGATIVE

## 2022-06-22 MED ORDER — IPRATROPIUM-ALBUTEROL 0.5-2.5 (3) MG/3ML IN SOLN
RESPIRATORY_TRACT | Status: AC
  Filled 2022-06-22: qty 3

## 2022-06-22 MED ORDER — METHYLPREDNISOLONE SODIUM SUCC 125 MG IJ SOLR
60.0000 mg | Freq: Once | INTRAMUSCULAR | Status: AC
  Administered 2022-06-22: 60 mg via INTRAMUSCULAR

## 2022-06-22 MED ORDER — ALBUTEROL SULFATE HFA 108 (90 BASE) MCG/ACT IN AERS: 1.0000 | INHALATION_SPRAY | Freq: Four times a day (QID) | RESPIRATORY_TRACT | 0 refills | Status: AC | PRN

## 2022-06-22 MED ORDER — PREDNISONE 10 MG (21) PO TBPK: ORAL_TABLET | ORAL | 0 refills | Status: DC

## 2022-06-22 MED ORDER — METHYLPREDNISOLONE SODIUM SUCC 125 MG IJ SOLR
INTRAMUSCULAR | Status: AC
  Filled 2022-06-22: qty 2

## 2022-06-22 MED ORDER — PROMETHAZINE-DM 6.25-15 MG/5ML PO SYRP: 5.0000 mL | ORAL_SOLUTION | Freq: Four times a day (QID) | ORAL | 0 refills | Status: DC | PRN

## 2022-06-22 MED ORDER — IPRATROPIUM-ALBUTEROL 0.5-2.5 (3) MG/3ML IN SOLN
3.0000 mL | Freq: Once | RESPIRATORY_TRACT | Status: AC
Start: 2022-06-22 — End: 2022-06-22
  Administered 2022-06-22: 3 mL via RESPIRATORY_TRACT

## 2022-06-22 NOTE — ED Triage Notes (Signed)
Pt reports since Sunday having fevers, cough, congestion, feeling horrible,. Taking OTC cold/sinus meds, airborne without relief.

## 2022-06-22 NOTE — ED Provider Notes (Signed)
[name]    CSN: [id number] Arrival date & time: 06/22/22  1059      History   Chief Complaint Chief Complaint  Patient presents with   appt 11   Cough    HPI Jerry Fischer is a 37 y.o. male.   Patient presents today with a 3 to 4-day history of URI symptoms including fever, cough, sore throat, nasal congestion.  Denies any chest pain, shortness of breath, nausea, vomiting, diarrhea.  Denies any known sick contacts but has been discharged around several people.  He has been using Mucinex, Tylenol, Sudafed without improvement of symptoms.  Denies any history of allergies, asthma, COPD.  He is a former smoker but quit several years ago and now only vapes.  He has not been doing this since symptoms began.  He has had COVID in February 2024.  Reports that he was treated for a secondary sinus infection the end of February with antibiotics and steroids.  Denies additional antibiotics or steroids in the past 90 days.  He denies any history of cardiovascular disease or heart disease.  He is having difficulty with daily duties as a result of symptoms.    Past Medical History:  Diagnosis Date   Fracture    tibia and fibula, right   Pneumonia    x 1 as a child    Patient Active Problem List   Diagnosis Date Noted   History of substance abuse 03/26/2020    Past Surgical History:  Procedure Laterality Date   EXTERNAL FIXATION LEG Right 11/08/2019   Procedure: EXTERNAL FIXATION LEG;  Surgeon: Altamese [place], MD;  Location: [facility] [facility];  Service: Orthopedics;  Laterality: Right;   EXTERNAL FIXATION LEG Right 11/29/2019   Procedure: EXTERNAL FIXATION LEG, removal;  Surgeon: Altamese [place], MD;  Location: [facility];  Service: Orthopedics;  Laterality: Right;   HARDWARE REMOVAL Right 03/27/2020   Procedure: HARDWARE REMOVAL;  Surgeon: Altamese Bonney, MD;  Location: [facility];  Service: Orthopedics;  Laterality: Right;   ORIF ANKLE FRACTURE Right 11/29/2019   Procedure: OPEN REDUCTION INTERNAL  FIXATION (ORIF) ANKLE FRACTURE;  Surgeon: Altamese [place]ch[place], MD;  Location: [facility];  Service: Orthopedics;  Laterality: Right;   TONSILLECTOMY     WISDOM TOOTH EXTRACTION         Home Medications    Prior to Admission medications   Medication Sig Start Date End Date Taking? Authorizing Provider  albuterol (VENTOLIN HFA) 108 (90 Base) MCG/ACT inhaler Inhale 1-2 puffs into the lungs every 6 (six) hours as needed for wheezing or shortness of breath. 06/22/22  Yes Lola Lofaro K, PA-C  predniSONE (STERAPRED UNI-PAK 21 TAB) 10 MG (21) TBPK tablet As directed 06/22/22  Yes Taina Landry K, PA-C  promethazine-dextromethorphan (PROMETHAZINE-DM) 6.25-15 MG/5ML syrup Take 5 mLs by mouth 4 (four) times daily as needed for cough. 06/22/22  Yes Kaydin Karbowski, Derry Skill, PA-C    Family History Family History  Problem Relation Age of Onset   Healthy Mother    Healthy Father     Social History Social History   Tobacco Use   Smoking status: Former    Packs/day: 0.25    Years: 10.00    Additional pack years: 0.00    Total pack years: 2.50    Types: Cigarettes    Quit date: 12/2019    Years since quitting: 2.5   Smokeless tobacco: Never  Vaping Use   Vaping Use: Some days  Substance Use Topics   Alcohol use: Yes  Comment: occasional   Drug use: No     Allergies   Patient has no known allergies.   Review of Systems Review of Systems  Constitutional:  Positive for activity change, fatigue and fever. Negative for appetite change.  HENT:  Positive for congestion and sore throat. Negative for sinus pressure and sneezing.   Respiratory:  Positive for cough. Negative for shortness of breath.   Cardiovascular:  Negative for chest pain.  Gastrointestinal:  Negative for abdominal pain, diarrhea, nausea and vomiting.  Neurological:  Negative for dizziness, light-headedness and headaches.     Physical Exam Triage Vital Signs ED Triage Vitals  Enc Vitals Group     BP 06/22/22 1117 116/79     Pulse  Rate 06/22/22 1117 96     Resp 06/22/22 1117 16     Temp 06/22/22 1117 98.1 F (36.7 C)     Temp Source 06/22/22 1117 Oral     SpO2 06/22/22 1117 97 %     Weight --      Height --      Head Circumference --      Peak Flow --      Pain Score 06/22/22 1116 5     Pain Loc --      Pain Edu? --      Excl. in [place]? --    No data found.  Updated Vital Signs BP 116/79 (BP Location: Right Arm)   Pulse 96   Temp 98.1 F (36.7 C) (Oral)   Resp 16   SpO2 97%   Visual Acuity Right Eye Distance:   Left Eye Distance:   Bilateral Distance:    Right Eye Near:   Left Eye Near:    Bilateral Near:     Physical Exam Vitals reviewed.  Constitutional:      General: He is awake.     Appearance: Normal appearance. He is well-developed. He is not ill-appearing.     Comments: Very pleasant male appears sedated no acute distress sitting comfortably in exam room  HENT:     Head: Normocephalic and atraumatic.     Right Ear: Tympanic membrane, ear canal and external ear normal. Tympanic membrane is not erythematous or bulging.     Left Ear: Tympanic membrane, ear canal and external ear normal. Tympanic membrane is not erythematous or bulging.     Nose: Nose normal.     Mouth/Throat:     Pharynx: Uvula midline. Posterior oropharyngeal erythema present. No oropharyngeal exudate.  Cardiovascular:     Rate and Rhythm: Normal rate and regular rhythm.     Heart sounds: Normal heart sounds, S1 normal and S2 normal. No murmur heard. Pulmonary:     Effort: Pulmonary effort is normal. No accessory muscle usage or respiratory distress.     Breath sounds: No stridor. Wheezing present. No rhonchi or rales.     Comments: Scattered wheezing Neurological:     Mental Status: He is alert.  Psychiatric:        Behavior: Behavior is cooperative.      UC Treatments / Results  Labs (all labs ordered are listed, but only abnormal results are displayed) Labs Reviewed  CULTURE, GROUP A STREP [facility])  POCT  RAPID STREP A, ED / UC    EKG   Radiology No results found.  Procedures Procedures (including critical care time)  Medications Ordered in UC Medications  ipratropium-albuterol (DUONEB) 0.5-2.5 (3) MG/3ML nebulizer solution 3 mL (3 mLs Nebulization Given 06/22/22 1201)  methylPREDNISolone sodium  succinate (SOLU-MEDROL) 125 mg/2 mL injection 60 mg (60 mg Intramuscular Given 06/22/22 1201)    Initial Impression / Assessment and Plan / UC Course  I have reviewed the triage vital signs and the nursing notes.  Pertinent labs & imaging results that were available during my care of the patient were reviewed by me and considered in my medical decision making (see chart for details).     Patient is well-appearing, afebrile, nontoxic, nontachycardic.  Flu testing was deferred as he is already been symptomatic for several days and this would not change management as he is outside the window of effectiveness for Tamiflu.  COVID testing was deferred as he recently recovered from [place] within the last 90 days.  No evidence of acute infection on physical exam that warrant initiation of antibiotics.  He was given DuoNeb and 60 mg of methylprednisolone during clinic visit which provided improvement of symptoms.  He was sent home with albuterol inhaler with instruction to use this every 4-6 hours as needed.  He will also start prednisone taper and he was instructed not to take NSAIDs with this medication due to risk of GI bleeding.  He was prescribed meclizine DM for cough.  Discussed that this can be sedating and is not to drive or drink alcohol taking it.  Can use over-the-counter medication including Mucinex, Flonase, Tylenol.  He is to rest and drink plenty of fluid.  If his symptoms do not improve within a week he is to return for reevaluation.  If he has any worsening symptoms including chest pain, shortness of breath, high fever not respond to medication, weakness, worsening cough he needs to be seen  immediately.  Strict return precautions given.  Work excuse note provided.  Final Clinical Impressions(s) / UC Diagnoses   Final diagnoses:  Acute bronchitis, unspecified organism  Acute cough  Sore throat     Discharge Instructions      I am concerned that you have a viral bronchitis.  We gave you injection of steroids today.  Please start prednisone taper tomorrow (06/23/2022).  Do not take NSAIDs with this medication including aspirin, ibuprofen/Advil, naproxen/Aleve.  You can use acetaminophen/Tylenol for pain and fever.  Take albuterol every 4-6 hours as needed for shortness of breath and coughing fits.  Use Promethazine DM for cough.  This make you sleepy so do not drive or drink alcohol with taking it.  Make sure that you rest and drink plenty of fluid.  If your symptoms or not proving within a week return for reevaluation.  If anything worsens and you have chest pain, shortness of breath, weakness you need to be seen immediately.     ED Prescriptions     Medication Sig Dispense Auth. Provider   predniSONE (STERAPRED UNI-PAK 21 TAB) 10 MG (21) TBPK tablet As directed 21 tablet Decorian Schuenemann K, PA-C   albuterol (VENTOLIN HFA) 108 (90 Base) MCG/ACT inhaler Inhale 1-2 puffs into the lungs every 6 (six) hours as needed for wheezing or shortness of breath. 18 g Khambrel Amsden K, PA-C   promethazine-dextromethorphan (PROMETHAZINE-DM) 6.25-15 MG/5ML syrup Take 5 mLs by mouth 4 (four) times daily as needed for cough. 118 mL Audrey Thull K, PA-C      PDMP not reviewed this encounter.   Terrilee Croak, PA-C 06/22/22 1243

## 2022-06-22 NOTE — Discharge Instructions (Signed)
I am concerned that you have a viral bronchitis.  We gave you injection of steroids today.  Please start prednisone taper tomorrow (06/23/2022).  Do not take NSAIDs with this medication including aspirin, ibuprofen/Advil, naproxen/Aleve.  You can use acetaminophen/Tylenol for pain and fever.  Take albuterol every 4-6 hours as needed for shortness of breath and coughing fits.  Use Promethazine DM for cough.  This make you sleepy so do not drive or drink alcohol with taking it.  Make sure that you rest and drink plenty of fluid.  If your symptoms or not proving within a week return for reevaluation.  If anything worsens and you have chest pain, shortness of breath, weakness you need to be seen immediately.

## 2022-06-23 LAB — CULTURE, GROUP A STREP (THRC)

## 2022-10-19 ENCOUNTER — Ambulatory Visit (HOSPITAL_COMMUNITY)
Admission: RE | Admit: 2022-10-19 | Discharge: 2022-10-19 | Disposition: A | Discharge status: Home / Self Care | Payer: Self-pay | Source: Ambulatory Visit | Attending: Family Medicine | Admitting: Family Medicine

## 2022-10-19 ENCOUNTER — Encounter (HOSPITAL_COMMUNITY): Payer: Self-pay

## 2022-10-19 VITALS — BP 116/73 | HR 98 | Temp 98.7°F | Resp 18

## 2022-10-19 DIAGNOSIS — L03111 Cellulitis of right axilla: Secondary | ICD-10-CM

## 2022-10-19 DIAGNOSIS — L309 Dermatitis, unspecified: Secondary | ICD-10-CM

## 2022-10-19 MED ORDER — AMOXICILLIN-POT CLAVULANATE 875-125 MG PO TABS: 1.0000 | ORAL_TABLET | Freq: Two times a day (BID) | ORAL | 0 refills | Status: AC

## 2022-10-19 MED ORDER — KETOCONAZOLE 2 % EX CREA: 1.0000 | TOPICAL_CREAM | Freq: Every day | CUTANEOUS | 0 refills | Status: DC

## 2022-10-19 MED ORDER — METHYLPREDNISOLONE ACETATE 80 MG/ML IJ SUSP
80.0000 mg | Freq: Once | INTRAMUSCULAR | Status: AC
  Administered 2022-10-19: 80 mg via INTRAMUSCULAR

## 2022-10-19 MED ORDER — METHYLPREDNISOLONE SODIUM SUCC 125 MG IJ SOLR
INTRAMUSCULAR | Status: AC
  Filled 2022-10-19: qty 2

## 2022-10-19 MED ORDER — METHYLPREDNISOLONE ACETATE 80 MG/ML IJ SUSP
INTRAMUSCULAR | Status: AC
  Filled 2022-10-19: qty 1

## 2022-10-19 NOTE — ED Triage Notes (Signed)
[place] [facility] he was treated for a fungal infection while there but not he has a recurrent rash on his legs and he also has some swollen spots under his right arm.

## 2022-10-19 NOTE — ED Provider Notes (Signed)
[facility]    CSN: [id number] Arrival date & time: 10/19/22  1306      History   Chief Complaint Chief Complaint  Patient presents with   Rash    Fungi from showers in jail, all over left side of body. Swollen lymph node in armpit - Entered by patient    HPI Jerry Fischer is a 37 y.o. male.    Rash Here for rash on both sides of his body on his trunk and legs.  When it first began he had a swollen spot in his left axilla.  At the time he was in [place] detention and was treated with traditional antibiotics and topical antifungals.  He did improve, but now he has more rash on the right side of his body and has a swollen spot in his right axilla.  No fever or chills.  The rash is pruritic    Past Medical History:  Diagnosis Date   Fracture    tibia and fibula, right   Pneumonia    x 1 as a child    Patient Active Problem List   Diagnosis Date Noted   History of substance abuse ([facility]) 03/26/2020    Past Surgical History:  Procedure Laterality Date   EXTERNAL FIXATION LEG Right 11/08/2019   Procedure: EXTERNAL FIXATION LEG;  Surgeon: Myrene Galas, MD;  [facility];  Service: Orthopedics;  Laterality: Right;   EXTERNAL FIXATION LEG Right 11/29/2019   Procedure: EXTERNAL FIXATION LEG, removal;  Surgeon: Myrene Galas, MD;  [facility];  Service: Orthopedics;  Laterality: Right;   HARDWARE REMOVAL Right 03/27/2020   Procedure: HARDWARE REMOVAL;  Surgeon: Myrene Galas, MD;  [facility];  Service: Orthopedics;  Laterality: Right;   ORIF ANKLE FRACTURE Right 11/29/2019   Procedure: OPEN REDUCTION INTERNAL FIXATION (ORIF) ANKLE FRACTURE;  Surgeon: Myrene Galas, MD;  [facility];  Service: Orthopedics;  Laterality: Right;   TONSILLECTOMY     WISDOM TOOTH EXTRACTION         Home Medications    Prior to Admission medications   Medication Sig Start Date End Date Taking? Authorizing Provider  amoxicillin-clavulanate (AUGMENTIN) 875-125 MG tablet  Take 1 tablet by mouth 2 (two) times daily for 7 days. 10/19/22 10/26/22 Yes Zenia Resides, MD  ketoconazole (NIZORAL) 2 % cream Apply 1 Application topically daily. To affected area till better 10/19/22  Yes Daja Shuping, Janace Aris, MD  albuterol (VENTOLIN HFA) 108 (90 Base) MCG/ACT inhaler Inhale 1-2 puffs into the lungs every 6 (six) hours as needed for wheezing or shortness of breath. 06/22/22   Raspet, Noberto Retort, PA-C    Family History Family History  Problem Relation Age of Onset   Healthy Mother    Healthy Father     Social History Social History   Tobacco Use   Smoking status: Former    Current packs/day: 0.00    Average packs/day: 0.3 packs/day for 10.0 years (2.5 ttl pk-yrs)    Types: Cigarettes    Start date: 12/2009    Quit date: 12/2019    Years since quitting: 2.8   Smokeless tobacco: Never  Vaping Use   Vaping status: Some Days  Substance Use Topics   Alcohol use: Yes    Comment: occasional   Drug use: No     Allergies   Patient has no known allergies.   Review of Systems Review of Systems  Skin:  Positive for rash.     Physical Exam Triage Vital  Signs ED Triage Vitals [10/19/22 1327]  Encounter Vitals Group     BP 116/73     Systolic BP Percentile      Diastolic BP Percentile      Pulse Rate 98     Resp 18     Temp 98.7 F (37.1 C)     Temp Source Oral     SpO2 98 %     Weight      Height      Head Circumference      Peak Flow      Pain Score 0     Pain Loc      Pain Education      Exclude from Growth Chart    No data found.  Updated Vital Signs BP 116/73 (BP Location: Right Arm)   Pulse 98   Temp 98.7 F (37.1 C) (Oral)   Resp 18   SpO2 98%   Visual Acuity Right Eye Distance:   Left Eye Distance:   Bilateral Distance:    Right Eye Near:   Left Eye Near:    Bilateral Near:     Physical Exam Vitals reviewed.  Constitutional:      General: He is not in acute distress.    Appearance: He is not ill-appearing, toxic-appearing  or diaphoretic.  Skin:    Coloration: Skin is not pale.     Comments: In his right axilla there is an erythematous indurated swelling about 1 x 1.5 cm.  It is firm and not fluctuant at this time; it is tender.  On his trunk he shows me a maculopapular rash.  There is no scaling or central clearing  Neurological:     Mental Status: He is alert and oriented to person, place, and time.  Psychiatric:        Behavior: Behavior normal.      UC Treatments / Results  Labs (all labs ordered are listed, but only abnormal results are displayed) Labs Reviewed - No data to display  EKG   Radiology No results found.  Procedures Procedures (including critical care time)  Medications Ordered in UC Medications  methylPREDNISolone acetate (DEPO-MEDROL) injection 80 mg (has no administration in time range)    Initial Impression / Assessment and Plan / UC Course  I have reviewed the triage vital signs and the nursing notes.  Pertinent labs & imaging results that were available during my care of the patient were reviewed by me and considered in my medical decision making (see chart for details).     Augmentin is sent in for the cellulitis.  He will be doing warm compresses and we discussed what to do if it needs drainage.  Steroid shot is given here, as I think most of his dermatitis is more allergic or contact.  Topical Nizoral cream is sent in for once daily use on the rash, to cover for antifungal treatment Final Clinical Impressions(s) / UC Diagnoses   Final diagnoses:  Cellulitis of right axilla  Dermatitis     Discharge Instructions      You have been given an injection of methylprednisolone 80 mg, steroid.  Take amoxicillin-clavulanate 875 mg--1 tab twice daily with food for 7 days  Ketoconazole cream--apply once daily to the rash is better and resolving  Apply warm compresses to the swollen area under your arm.       ED Prescriptions     Medication Sig Dispense  Auth. Provider   amoxicillin-clavulanate (AUGMENTIN) 875-125 MG tablet Take 1  tablet by mouth 2 (two) times daily for 7 days. 14 tablet Zenia Resides, MD   ketoconazole (NIZORAL) 2 % cream Apply 1 Application topically daily. To affected area till better 120 g Marlinda Mike Janace Aris, MD      PDMP not reviewed this encounter.   Zenia Resides, MD 10/19/22 1346

## 2022-10-19 NOTE — Discharge Instructions (Signed)
You have been given an injection of methylprednisolone 80 mg, steroid.  Take amoxicillin-clavulanate 875 mg--1 tab twice daily with food for 7 days  Ketoconazole cream--apply once daily to the rash is better and resolving  Apply warm compresses to the swollen area under your arm.

## 2022-10-29 ENCOUNTER — Encounter (HOSPITAL_COMMUNITY): Payer: Self-pay

## 2022-10-29 ENCOUNTER — Ambulatory Visit (HOSPITAL_COMMUNITY)
Admission: RE | Admit: 2022-10-29 | Discharge: 2022-10-29 | Disposition: A | Discharge status: Home / Self Care | Payer: Self-pay | Source: Ambulatory Visit | Attending: Emergency Medicine | Admitting: Emergency Medicine

## 2022-10-29 VITALS — BP 124/75 | HR 88 | Temp 98.7°F | Resp 17

## 2022-10-29 DIAGNOSIS — L282 Other prurigo: Secondary | ICD-10-CM

## 2022-10-29 DIAGNOSIS — L03116 Cellulitis of left lower limb: Secondary | ICD-10-CM

## 2022-10-29 MED ORDER — PREDNISONE 10 MG (21) PO TBPK: ORAL_TABLET | Freq: Every day | ORAL | 0 refills | Status: DC

## 2022-10-29 MED ORDER — DOXYCYCLINE HYCLATE 100 MG PO CAPS: 100.0000 mg | ORAL_CAPSULE | Freq: Two times a day (BID) | ORAL | 0 refills | Status: AC

## 2022-10-29 NOTE — ED Provider Notes (Signed)
[facility]    CSN: [id number] Arrival date & time: 10/29/22  1409     History   Chief Complaint Chief Complaint  Patient presents with   Rash    HPI ZAHIN OHANLON is a 37 y.o. male.  Here with persisting rash and itching of the lower legs Seen for this 7/30. Rash was on torso, maculopapular. At that time also had possible cellulitis of right axilla. Was prescribed a week of augmentin, topical ketoconazole , and given steroid shot  Denies having any improvement in itching. The axillary area resolved with augmentin.  [facility] He received antibiotics while there, as well as topical antifungals. Medication names unknown.   New as of this morning is redness, warmth, tenderness, and swelling of left ankle. No injury or trauma.  No interventions yet. No fever or chills  Past Medical History:  Diagnosis Date   Fracture    tibia and fibula, right   Pneumonia    x 1 as a child    Patient Active Problem List   Diagnosis Date Noted   History of substance abuse (HCC) 03/26/2020    Past Surgical History:  Procedure Laterality Date   EXTERNAL FIXATION LEG Right 11/08/2019   Procedure: EXTERNAL FIXATION LEG;  Surgeon: Myrene Galas, MD;  Location: [facility];  Service: Orthopedics;  Laterality: Right;   EXTERNAL FIXATION LEG Right 11/29/2019   Procedure: EXTERNAL FIXATION LEG, removal;  Surgeon: Myrene Galas, MD;  Location: [facility] OR;  Service: Orthopedics;  Laterality: Right;   HARDWARE REMOVAL Right 03/27/2020   Procedure: HARDWARE REMOVAL;  Surgeon: Myrene Galas, MD;  Location: [facility] OR;  Service: Orthopedics;  Laterality: Right;   ORIF ANKLE FRACTURE Right 11/29/2019   Procedure: OPEN REDUCTION INTERNAL FIXATION (ORIF) ANKLE FRACTURE;  Surgeon: Myrene Galas, MD;  Location: [facility] OR;  Service: Orthopedics;  Laterality: Right;   TONSILLECTOMY     WISDOM TOOTH EXTRACTION       Home Medications    Prior to Admission medications   Medication Sig Start  Date End Date Taking? Authorizing Provider  doxycycline (VIBRAMYCIN) 100 MG capsule Take 1 capsule (100 mg total) by mouth 2 (two) times daily for 5 days. 10/29/22 11/03/22 Yes , Lurena Joiner, PA-C  predniSONE (STERAPRED UNI-PAK 21 TAB) 10 MG (21) TBPK tablet Take by mouth daily. Please take as directed 10/29/22  Yes , Lurena Joiner, PA-C  albuterol (VENTOLIN HFA) 108 (90 Base) MCG/ACT inhaler Inhale 1-2 puffs into the lungs every 6 (six) hours as needed for wheezing or shortness of breath. 06/22/22   Raspet, Noberto Retort, PA-C  ketoconazole (NIZORAL) 2 % cream Apply 1 Application topically daily. To affected area till better 10/19/22   Zenia Resides, MD    Family History Family History  Problem Relation Age of Onset   Healthy Mother    Healthy Father     Social History Social History   Tobacco Use   Smoking status: Former    Current packs/day: 0.00    Average packs/day: 0.3 packs/day for 10.0 years (2.5 ttl pk-yrs)    Types: Cigarettes    Start date: 12/2009    Quit date: 12/2019    Years since quitting: 2.8   Smokeless tobacco: Never  Vaping Use   Vaping status: Some Days  Substance Use Topics   Alcohol use: Yes    Comment: occasional   Drug use: No     Allergies   Patient has no known allergies.   Review of Systems  Review of Systems As per HPI  Physical Exam Triage Vital Signs ED Triage Vitals  Encounter Vitals Group     BP 10/29/22 1424 124/75     Systolic BP Percentile --      Diastolic BP Percentile --      Pulse Rate 10/29/22 1424 88     Resp 10/29/22 1424 17     Temp 10/29/22 1424 98.7 F (37.1 C)     Temp Source 10/29/22 1424 Oral     SpO2 10/29/22 1424 97 %     Weight --      Height --      Head Circumference --      Peak Flow --      Pain Score 10/29/22 1425 6     Pain Loc --      Pain Education --      Exclude from Growth Chart --    No data found.  Updated Vital Signs BP 124/75 (BP Location: Left Arm)   Pulse 88   Temp 98.7 F (37.1 C)  (Oral)   Resp 17   SpO2 97%   Physical Exam Vitals and nursing note reviewed.  Constitutional:      General: He is not in acute distress.    Appearance: Normal appearance.  HENT:     Mouth/Throat:     Mouth: Mucous membranes are moist.     Pharynx: Oropharynx is clear. No posterior oropharyngeal erythema.  Eyes:     Conjunctiva/sclera: Conjunctivae normal.     Pupils: Pupils are equal, round, and reactive to light.  Cardiovascular:     Rate and Rhythm: Normal rate and regular rhythm.     Pulses: Normal pulses.     Heart sounds: Normal heart sounds.  Pulmonary:     Effort: Pulmonary effort is normal.     Breath sounds: Normal breath sounds.  Abdominal:     General: Bowel sounds are normal.     Tenderness: There is no abdominal tenderness.  Musculoskeletal:        General: Normal range of motion.     Cervical back: Normal range of motion.     Comments: Left ankle is tender to light touch. Erythematous laterally. Mild swelling, non pitting. Good ROM of ankle and distal sensation intact, cap refill < 2 seconds   Skin:    Findings: Rash present.     Comments: Faint bumps on bilateral lower legs. Excoriated areas. There is no rash on the arms, trunk, chest, face.  Neurological:     Mental Status: He is alert and oriented to person, place, and time.     UC Treatments / Results  Labs (all labs ordered are listed, but only abnormal results are displayed) Labs Reviewed - No data to display  EKG  Radiology No results found.  Procedures Procedures (including critical care time)  Medications Ordered in UC Medications - No data to display  Initial Impression / Assessment and Plan / UC Course  I have reviewed the triage vital signs and the nursing notes.  Pertinent labs & imaging results that were available during my care of the patient were reviewed by me and considered in my medical decision making (see chart for details).  Cellulitis left ankle Warm to touch, tender,  erythematous, some swelling. No injury or trauma warranting xray today. This is new as of this morning, several days after he finished the augmentin. Start doxy BID x 5 days  Pruritic rash on legs Improved from previous note  as it was documented on his trunk/torso, now only legs. However patient reports itching is worse. Today this does not appear fungal. Try prednisone 6 day taper. Needs to follow with dermatology if persisting  Discussed ED precautions for worsening symptoms Referral to derm placed  Final Clinical Impressions(s) / UC Diagnoses   Final diagnoses:  Cellulitis of left ankle  Pruritic rash     Discharge Instructions      Take the doxycycline as prescribed.  This is an antibiotic to treat likely cellulitis of your left ankle.  Please take the prednisone as prescribed.  Finish all the pills as directed.  This is to stop itching and rash.  You need to follow with a dermatologist.   Please go to the emergency department if symptoms worsen.     ED Prescriptions     Medication Sig Dispense Auth. Provider   doxycycline (VIBRAMYCIN) 100 MG capsule Take 1 capsule (100 mg total) by mouth 2 (two) times daily for 5 days. 10 capsule , , PA-C   predniSONE (STERAPRED UNI-PAK 21 TAB) 10 MG (21) TBPK tablet Take by mouth daily. Please take as directed 21 tablet , Lurena Joiner, PA-C      PDMP not reviewed this encounter.   Marlow Baars, [place] 10/29/22 1540

## 2022-10-29 NOTE — Discharge Instructions (Addendum)
Take the doxycycline as prescribed.  This is an antibiotic to treat likely cellulitis of your left ankle.  Please take the prednisone as prescribed.  Finish all the pills as directed.  This is to stop itching and rash.  You need to follow with a dermatologist.   Please go to the emergency department if symptoms worsen.

## 2022-10-29 NOTE — ED Triage Notes (Signed)
Pt reports that [place] is not any better.  Pt now has redness and swelling around left ankle since yesterday. Denies injury.

## 2023-03-13 ENCOUNTER — Other Ambulatory Visit: Payer: Self-pay

## 2023-03-13 ENCOUNTER — Ambulatory Visit (HOSPITAL_COMMUNITY)
Admission: EM | Admit: 2023-03-13 | Discharge: 2023-03-13 | Disposition: A | Discharge status: Home / Self Care | Payer: MEDICAID | Attending: Emergency Medicine | Admitting: Emergency Medicine

## 2023-03-13 ENCOUNTER — Encounter (HOSPITAL_COMMUNITY): Payer: Self-pay | Admitting: *Deleted

## 2023-03-13 DIAGNOSIS — J069 Acute upper respiratory infection, unspecified: Secondary | ICD-10-CM

## 2023-03-13 DIAGNOSIS — R062 Wheezing: Secondary | ICD-10-CM | POA: Diagnosis not present

## 2023-03-13 LAB — POCT INFLUENZA A/B
Influenza A, POC: NEGATIVE
Influenza B, POC: NEGATIVE

## 2023-03-13 MED ORDER — PREDNISONE 20 MG PO TABS: 40.0000 mg | ORAL_TABLET | Freq: Every day | ORAL | 0 refills | Status: AC

## 2023-03-13 MED ORDER — PROMETHAZINE-DM 6.25-15 MG/5ML PO SYRP: 5.0000 mL | ORAL_SOLUTION | Freq: Four times a day (QID) | ORAL | 0 refills | Status: DC | PRN

## 2023-03-13 NOTE — ED Provider Notes (Signed)
[facility]    CSN: [id number] Arrival date & time: 03/13/23  1138      History   Chief Complaint Chief Complaint  Patient presents with   Cough   Fever   Facial Pain    HPI Jerry Fischer is a 37 y.o. male.   Patient presents to clinic for hot and cold chills, congested cough and sinus pressure, sore throat and recent sick exposure.  He lives at home with his mother and father, father was sick initially.  Patient has had some wheezing.  Denies any shortness of breath.  Temperatures been 99-100 at home.  He has had multiple negative COVID-19 tests at home.  He has been taking Tylenol, last dose of this was last night.  Hx of polysubstance abuse.  Denies any history of asthma.  The history is provided by the patient and medical records.  Cough Fever   Past Medical History:  Diagnosis Date   Fracture    tibia and fibula, right   Pneumonia    x 1 as a child    Patient Active Problem List   Diagnosis Date Noted   History of substance abuse (HCC) 03/26/2020    Past Surgical History:  Procedure Laterality Date   EXTERNAL FIXATION LEG Right 11/08/2019   Procedure: EXTERNAL FIXATION LEG;  Surgeon: Myrene Galas, MD;  Location: [facility];  Service: Orthopedics;  Laterality: Right;   EXTERNAL FIXATION LEG Right 11/29/2019   Procedure: EXTERNAL FIXATION LEG, removal;  Surgeon: Myrene Galas, MD;  Location: [facility];  Service: Orthopedics;  Laterality: Right;   HARDWARE REMOVAL Right 03/27/2020   Procedure: HARDWARE REMOVAL;  Surgeon: Myrene Galas, MD;  Location: [facility] OR;  Service: Orthopedics;  Laterality: Right;   ORIF ANKLE FRACTURE Right 11/29/2019   Procedure: OPEN REDUCTION INTERNAL FIXATION (ORIF) ANKLE FRACTURE;  Surgeon: Myrene Galas, MD;  Location: [facility];  Service: Orthopedics;  Laterality: Right;   TONSILLECTOMY     WISDOM TOOTH EXTRACTION         Home Medications    Prior to Admission medications   Medication Sig Start Date End Date Taking?  Authorizing Provider  predniSONE (DELTASONE) 20 MG tablet Take 2 tablets (40 mg total) by mouth daily for 5 days. 03/13/23 03/18/23 Yes Rinaldo Ratel, [place], FNP  promethazine-dextromethorphan (PROMETHAZINE-DM) 6.25-15 MG/5ML syrup Take 5 mLs by mouth 4 (four) times daily as needed for cough. 03/13/23  Yes Rinaldo Ratel, [place], FNP  albuterol (VENTOLIN HFA) 108 (90 Base) MCG/ACT inhaler Inhale 1-2 puffs into the lungs every 6 (six) hours as needed for wheezing or shortness of breath. 06/22/22   Raspet, Noberto Retort, PA-C    Family History Family History  Problem Relation Age of Onset   Healthy Mother    Healthy Father     Social History Social History   Tobacco Use   Smoking status: Former    Current packs/day: 0.00    Average packs/day: 0.3 packs/day for 10.0 years (2.5 ttl pk-yrs)    Types: Cigarettes    Start date: 12/2009    Quit date: 12/2019    Years since quitting: 3.2   Smokeless tobacco: Never  Vaping Use   Vaping status: Some Days  Substance Use Topics   Alcohol use: Yes    Comment: occasional   Drug use: No     Allergies   Patient has no known allergies.   Review of Systems Review of Systems  Per HPI   Physical Exam Triage Vital Signs ED Triage  Vitals  Encounter Vitals Group     BP 03/13/23 1305 (!) 152/100     Systolic BP Percentile --      Diastolic BP Percentile --      Pulse Rate 03/13/23 1305 86     Resp 03/13/23 1305 20     Temp 03/13/23 1305 97.9 F (36.6 C)     Temp src --      SpO2 03/13/23 1305 96 %     Weight --      Height --      Head Circumference --      Peak Flow --      Pain Score 03/13/23 1301 5     Pain Loc --      Pain Education --      Exclude from Growth Chart --    No data found.  Updated Vital Signs BP (!) 152/100   Pulse 86   Temp 97.9 F (36.6 C)   Resp 20   SpO2 96%   Visual Acuity Right Eye Distance:   Left Eye Distance:   Bilateral Distance:    Right Eye Near:   Left Eye Near:    Bilateral Near:      Physical Exam Vitals and nursing note reviewed.  Constitutional:      Appearance: Normal appearance.  HENT:     Head: Normocephalic and atraumatic.     Right Ear: External ear normal.     Left Ear: External ear normal.     Nose: Congestion and rhinorrhea present.     Mouth/Throat:     Mouth: Mucous membranes are moist.     Pharynx: Posterior oropharyngeal erythema present.  Eyes:     Conjunctiva/sclera: Conjunctivae normal.  Cardiovascular:     Rate and Rhythm: Normal rate and regular rhythm.     Heart sounds: Normal heart sounds. No murmur heard. Pulmonary:     Effort: Pulmonary effort is normal.     Breath sounds: Wheezing present.  Skin:    General: Skin is warm and dry.  Neurological:     General: No focal deficit present.     Mental Status: He is alert and oriented to person, place, and time.  Psychiatric:        Mood and Affect: Mood normal.        Behavior: Behavior normal.      UC Treatments / Results  Labs (all labs ordered are listed, but only abnormal results are displayed) Labs Reviewed  POCT INFLUENZA A/B    EKG   Radiology No results found.  Procedures Procedures (including critical care time)  Medications Ordered in UC Medications - No data to display  Initial Impression / Assessment and Plan / UC Course  I have reviewed the triage vital signs and the nursing notes.  Pertinent labs & imaging results that were available during my care of the patient were reviewed by me and considered in my medical decision making (see chart for details).  Vitals and triage reviewed, patient is hemodynamically stable.  Rhinorrhea, congestion, posterior pharynx erythema and postnasal drip present on physical exam.  Expiratory wheezing in all lobes auscultated.  Will cover with prednisone, patient has his albuterol inhaler from last visit.  Flu testing obtained, patient declined to wait for results.  Discussed viral URI symptomatic management.  Cough management  discussed.  Plan of care, follow-up care return precautions given, no questions at this time.   Flu testing was negative.  Continue symptomatic management of viral URI.  Final Clinical Impressions(s) / UC Diagnoses   Final diagnoses:  Viral URI with cough  Wheezing     Discharge Instructions      Continue symptomatic management.  Alternate between 500 mg of Tylenol and 800 mg of ibuprofen every 4-6 hours for fever, body aches or chills.  Start the steroids daily with breakfast.  Use the cough syrup as needed, do not drink or drive on this as it may cause drowsiness.  Mucinex 1200 mg daily can help dry up any congestion.  Ensure you are staying well-hydrated and getting plenty of rest.  Flu results will be back shortly on MyChart.     ED Prescriptions     Medication Sig Dispense Auth. Provider   predniSONE (DELTASONE) 20 MG tablet Take 2 tablets (40 mg total) by mouth daily for 5 days. 10 tablet Rinaldo Ratel, [place], [place]   promethazine-dextromethorphan (PROMETHAZINE-DM) 6.25-15 MG/5ML syrup Take 5 mLs by mouth 4 (four) times daily as needed for cough. 118 mL Alessia Gonsalez, [place], [place]      PDMP not reviewed this encounter.   Eulalah Rupert, [place] N, [place] 03/13/23 1410

## 2023-03-13 NOTE — ED Triage Notes (Signed)
Pt reports having a fever,cough and sinus pain.

## 2023-03-13 NOTE — Discharge Instructions (Addendum)
Continue symptomatic management.  Alternate between 500 mg of Tylenol and 800 mg of ibuprofen every 4-6 hours for fever, body aches or chills.  Start the steroids daily with breakfast.  Use the cough syrup as needed, do not drink or drive on this as it may cause drowsiness.  Mucinex 1200 mg daily can help dry up any congestion.  Ensure you are staying well-hydrated and getting plenty of rest.  Flu results will be back shortly on MyChart.

## 2023-03-23 ENCOUNTER — Encounter (HOSPITAL_COMMUNITY): Payer: Self-pay

## 2023-03-23 ENCOUNTER — Ambulatory Visit (INDEPENDENT_AMBULATORY_CARE_PROVIDER_SITE_OTHER): Payer: MEDICAID

## 2023-03-23 ENCOUNTER — Ambulatory Visit (HOSPITAL_COMMUNITY)
Admission: RE | Admit: 2023-03-23 | Discharge: 2023-03-23 | Disposition: A | Discharge status: Home / Self Care | Payer: MEDICAID | Source: Ambulatory Visit | Attending: Physician Assistant | Admitting: Physician Assistant

## 2023-03-23 VITALS — BP 100/68 | HR 75 | Temp 98.1°F | Resp 18 | Ht 74.0 in | Wt 200.0 lb

## 2023-03-23 DIAGNOSIS — R051 Acute cough: Secondary | ICD-10-CM | POA: Diagnosis not present

## 2023-03-23 DIAGNOSIS — J329 Chronic sinusitis, unspecified: Secondary | ICD-10-CM | POA: Diagnosis not present

## 2023-03-23 DIAGNOSIS — J40 Bronchitis, not specified as acute or chronic: Secondary | ICD-10-CM | POA: Diagnosis not present

## 2023-03-23 MED ORDER — BENZONATATE 100 MG PO CAPS: 100.0000 mg | ORAL_CAPSULE | Freq: Three times a day (TID) | ORAL | 0 refills | Status: AC

## 2023-03-23 MED ORDER — DOXYCYCLINE HYCLATE 100 MG PO CAPS: 100.0000 mg | ORAL_CAPSULE | Freq: Two times a day (BID) | ORAL | 0 refills | Status: AC

## 2023-03-23 NOTE — Discharge Instructions (Signed)
 I do not see any obvious pneumonia on your chest x-ray.  I will contact you if the radiologist sees something else that we need to change treatment plan.  Start doxycycline  100 mg twice daily for 10 days.  Stay out of the sun while on this medication as it can cause you to have a bad sunburn.  Use Tessalon  for cough.  Use over-the-counter medications including Mucinex, Tylenol , Flonase  for additional symptom relief.  Make sure that you are resting and drinking plenty of fluid.  If your symptoms are not improving within a week or if anything worsens and you have worsening cough, high fever, chest pain, shortness of breath you need to be seen immediately.

## 2023-03-23 NOTE — ED Provider Notes (Signed)
 [facility]    CSN: [id number] Arrival date & time: 03/23/23  1350      History   Chief Complaint Chief Complaint  Patient presents with   Cough    Was seen last week, symptoms have gotten worse, sinus pressure, chest congestion and bad cough. - Entered by patient   Nasal Congestion    HPI Jerry Fischer is a 38 y.o. male.   Patient presents today with a 2-week history of URI symptoms including congestion and cough.  Reports that he was seen on 03/13/2023 at which point he tested negative for flu.  He was given Promethazine  DM and prednisone  which has not provided any relief of symptoms.  He continues to have significant cough as well as subjective fever, fatigue, malaise, body aches, congestion, sinus pressure.  Denies any nausea, vomiting, chest pain, shortness of breath.  Denies any recent antibiotic use.  He has tried multiple over-the-counter medications including Mucinex, Sudafed, DayQuil, NyQuil.  He denies history of allergies, asthma, COPD, smoking.  He has taken at home COVID test that were negative.    Past Medical History:  Diagnosis Date   Fracture    tibia and fibula, right   Pneumonia    x 1 as a child    Patient Active Problem List   Diagnosis Date Noted   History of substance abuse (HCC) 03/26/2020    Past Surgical History:  Procedure Laterality Date   EXTERNAL FIXATION LEG Right 11/08/2019   Procedure: EXTERNAL FIXATION LEG;  Surgeon: Celena Sharper, MD;  Location: [facility] OR;  Service: Orthopedics;  Laterality: Right;   EXTERNAL FIXATION LEG Right 11/29/2019   Procedure: EXTERNAL FIXATION LEG, removal;  Surgeon: Celena Sharper, MD;  Location: [facility] OR;  Service: Orthopedics;  Laterality: Right;   HARDWARE REMOVAL Right 03/27/2020   Procedure: HARDWARE REMOVAL;  Surgeon: Celena Sharper, MD;  Location: [facility] OR;  Service: Orthopedics;  Laterality: Right;   ORIF ANKLE FRACTURE Right 11/29/2019   Procedure: OPEN REDUCTION INTERNAL FIXATION (ORIF) ANKLE FRACTURE;   Surgeon: Celena Sharper, MD;  Location: [facility] OR;  Service: Orthopedics;  Laterality: Right;   TONSILLECTOMY     WISDOM TOOTH EXTRACTION         Home Medications    Prior to Admission medications   Medication Sig Start Date End Date Taking? Authorizing Provider  benzonatate  (TESSALON ) 100 MG capsule Take 1 capsule (100 mg total) by mouth every 8 (eight) hours. 03/23/23  Yes Zhara Gieske, Rocky POUR, PA-C  doxycycline  (VIBRAMYCIN ) 100 MG capsule Take 1 capsule (100 mg total) by mouth 2 (two) times daily. 03/23/23  Yes Diavian Furgason, Rocky POUR, PA-C  albuterol  (VENTOLIN  HFA) 108 (90 Base) MCG/ACT inhaler Inhale 1-2 puffs into the lungs every 6 (six) hours as needed for wheezing or shortness of breath. 06/22/22   Julianna Vanwagner, Rocky POUR, PA-C    Family History Family History  Problem Relation Age of Onset   Healthy Mother    Healthy Father     Social History Social History   Tobacco Use   Smoking status: Former    Current packs/day: 0.00    Average packs/day: 0.3 packs/day for 10.0 years (2.5 ttl pk-yrs)    Types: Cigarettes    Start date: 12/2009    Quit date: 12/2019    Years since quitting: 3.2   Smokeless tobacco: Never  Vaping Use   Vaping status: Some Days  Substance Use Topics   Alcohol use: Yes    Comment: occasional   Drug use: No  Allergies   Patient has no known allergies.   Review of Systems Review of Systems  Constitutional:  Positive for activity change, fatigue and fever. Negative for appetite change.  HENT:  Positive for congestion, postnasal drip, sinus pressure and sore throat. Negative for sneezing.   Respiratory:  Positive for cough. Negative for shortness of breath.   Cardiovascular:  Negative for chest pain.  Gastrointestinal:  Negative for abdominal pain, diarrhea, nausea and vomiting.  Musculoskeletal:  Positive for arthralgias and myalgias.  Neurological:  Positive for headaches. Negative for dizziness and light-headedness.     Physical Exam Triage Vital Signs ED  Triage Vitals  Encounter Vitals Group     BP 03/23/23 1427 100/68     Systolic BP Percentile --      Diastolic BP Percentile --      Pulse Rate 03/23/23 1427 75     Resp 03/23/23 1427 18     Temp 03/23/23 1427 98.1 F (36.7 C)     Temp Source 03/23/23 1427 Oral     SpO2 03/23/23 1427 94 %     Weight 03/23/23 1426 200 lb (90.7 kg)     Height 03/23/23 1426 6' 2 (1.88 m)     Head Circumference --      Peak Flow --      Pain Score 03/23/23 1425 0     Pain Loc --      Pain Education --      Exclude from Growth Chart --    No data found.  Updated Vital Signs BP 100/68 (BP Location: Left Arm)   Pulse 75   Temp 98.1 F (36.7 C) (Oral)   Resp 18   Ht 6' 2 (1.88 m)   Wt 200 lb (90.7 kg)   SpO2 94%   BMI 25.68 kg/m   Visual Acuity Right Eye Distance:   Left Eye Distance:   Bilateral Distance:    Right Eye Near:   Left Eye Near:    Bilateral Near:     Physical Exam Vitals reviewed.  Constitutional:      General: He is awake.     Appearance: Normal appearance. He is well-developed. He is not ill-appearing.     Comments: Very pleasant male appears stated age in no acute distress sitting comfortably in exam room  HENT:     Head: Normocephalic and atraumatic.     Right Ear: Tympanic membrane, ear canal and external ear normal. Tympanic membrane is not erythematous or bulging.     Left Ear: Tympanic membrane, ear canal and external ear normal. Tympanic membrane is not erythematous or bulging.     Nose:     Right Sinus: Maxillary sinus tenderness and frontal sinus tenderness present.     Left Sinus: Maxillary sinus tenderness and frontal sinus tenderness present.     Mouth/Throat:     Pharynx: Uvula midline. Postnasal drip present. No oropharyngeal exudate, posterior oropharyngeal erythema or uvula swelling.  Cardiovascular:     Rate and Rhythm: Normal rate and regular rhythm.     Heart sounds: Normal heart sounds, S1 normal and S2 normal. No murmur heard. Pulmonary:      Effort: Pulmonary effort is normal. No accessory muscle usage or respiratory distress.     Breath sounds: No stridor. Examination of the right-lower field reveals decreased breath sounds. Examination of the left-lower field reveals decreased breath sounds. Decreased breath sounds present. No wheezing, rhonchi or rales.  Neurological:     Mental Status: He is  alert.  Psychiatric:        Behavior: Behavior is cooperative.      UC Treatments / Results  Labs (all labs ordered are listed, but only abnormal results are displayed) Labs Reviewed - No data to display  EKG   Radiology No results found.  Procedures Procedures (including critical care time)  Medications Ordered in UC Medications - No data to display  Initial Impression / Assessment and Plan / UC Course  I have reviewed the triage vital signs and the nursing notes.  Pertinent labs & imaging results that were available during my care of the patient were reviewed by me and considered in my medical decision making (see chart for details).     Patient is well-appearing, afebrile, nontoxic, nontachycardic.  Viral testing was deferred as he has already had this done and was negative needs been symptomatic for several weeks and this would not change our management.  Chest x-ray was obtained that showed patchy opacities with no evidence of focal consolidation.  We were waiting for radiologist overread at the time of discharge and we will contact him if this differs and changes our treatment plan.  Will start doxycycline  to cover for both sinobronchitis and atypical pneumonia given increased prevalence of mycoplasma pneumonia in the community.  We discussed that he should avoid prolonged sun exposure while on this medication due to associated photosensitivity.  Recommended he use over-the-counter medication including Mucinex, Flonase , Tylenol .  He was given Tessalon  for cough.  We discussed that if his symptoms are not improving within a  week or if anything worsens he needs to be seen immediately.  Strict return precautions given.  Work excuse note provided.  Final Clinical Impressions(s) / UC Diagnoses   Final diagnoses:  Acute cough  Sinobronchitis     Discharge Instructions      I do not see any obvious pneumonia on your chest x-ray.  I will contact you if the radiologist sees something else that we need to change treatment plan.  Start doxycycline  100 mg twice daily for 10 days.  Stay out of the sun while on this medication as it can cause you to have a bad sunburn.  Use Tessalon  for cough.  Use over-the-counter medications including Mucinex, Tylenol , Flonase  for additional symptom relief.  Make sure that you are resting and drinking plenty of fluid.  If your symptoms are not improving within a week or if anything worsens and you have worsening cough, high fever, chest pain, shortness of breath you need to be seen immediately.     ED Prescriptions     Medication Sig Dispense Auth. Provider   doxycycline  (VIBRAMYCIN ) 100 MG capsule Take 1 capsule (100 mg total) by mouth 2 (two) times daily. 20 capsule Danika Kluender K, PA-C   benzonatate  (TESSALON ) 100 MG capsule Take 1 capsule (100 mg total) by mouth every 8 (eight) hours. 21 capsule Zaniel Marineau K, PA-C      PDMP not reviewed this encounter.   Sherrell Rocky POUR, PA-C 03/23/23 1459

## 2023-03-23 NOTE — ED Triage Notes (Addendum)
 Pt reports he was seen here on 12/22 with the same symptoms as today. Pt reports cough and nasal congestion for more than two weeks. Pt states I have only gotten worse. I took the steroids as prescribed, the cough medicine did nothing for me. Taking Sudafed with little improvement. Pt states he is having fevers on and off, last one reported to be last night. Pt currently denies pain. At home COVID test negative two days ago.
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
## 2023-11-30 ENCOUNTER — Ambulatory Visit (HOSPITAL_COMMUNITY): Payer: MEDICAID

## 2023-12-01 ENCOUNTER — Ambulatory Visit (HOSPITAL_COMMUNITY): Payer: MEDICAID
